# Patient Record
Sex: Female | Born: 1990 | Race: Black or African American | Hispanic: Yes | Marital: Single | Smoking: Never smoker
Health system: Southern US, Community
[De-identification: ages and names within clinical notes are randomized; demographics above are authoritative.]

## PROBLEM LIST (undated history)

## (undated) ENCOUNTER — Ambulatory Visit (HOSPITAL_COMMUNITY): Admission: EM | Source: Home / Self Care

## (undated) DIAGNOSIS — E282 Polycystic ovarian syndrome: Secondary | ICD-10-CM

## (undated) DIAGNOSIS — O039 Complete or unspecified spontaneous abortion without complication: Secondary | ICD-10-CM

## (undated) HISTORY — DX: Complete or unspecified spontaneous abortion without complication: O03.9

## (undated) HISTORY — PX: NO PAST SURGERIES: SHX2092

## (undated) HISTORY — PX: DILATION AND CURETTAGE OF UTERUS: SHX78

---
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
## 2009-07-03 ENCOUNTER — Emergency Department: Payer: Self-pay | Admitting: Emergency Medicine

## 2010-07-19 ENCOUNTER — Ambulatory Visit: Payer: Self-pay | Admitting: Family Medicine

## 2010-07-21 ENCOUNTER — Emergency Department: Payer: Self-pay | Admitting: Emergency Medicine

## 2010-10-08 ENCOUNTER — Observation Stay: Payer: Self-pay

## 2010-10-15 ENCOUNTER — Observation Stay: Payer: Self-pay

## 2010-10-16 ENCOUNTER — Ambulatory Visit: Payer: Self-pay | Admitting: Obstetrics and Gynecology

## 2010-11-03 ENCOUNTER — Observation Stay: Payer: Self-pay | Admitting: Obstetrics and Gynecology

## 2010-11-22 ENCOUNTER — Observation Stay: Payer: Self-pay

## 2010-12-03 ENCOUNTER — Inpatient Hospital Stay: Payer: Self-pay

## 2014-07-13 ENCOUNTER — Emergency Department: Payer: Self-pay | Admitting: Emergency Medicine

## 2014-07-13 LAB — CBC
HCT: 42.3 % (ref 35.0–47.0)
HGB: 14.1 g/dL (ref 12.0–16.0)
MCH: 30.2 pg (ref 26.0–34.0)
MCHC: 33.3 g/dL (ref 32.0–36.0)
MCV: 91 fL (ref 80–100)
PLATELETS: 279 10*3/uL (ref 150–440)
RBC: 4.67 10*6/uL (ref 3.80–5.20)
RDW: 12.8 % (ref 11.5–14.5)
WBC: 9.5 10*3/uL (ref 3.6–11.0)

## 2014-07-13 LAB — COMPREHENSIVE METABOLIC PANEL
ALBUMIN: 3.6 g/dL (ref 3.4–5.0)
AST: 18 U/L (ref 15–37)
Alkaline Phosphatase: 82 U/L
Anion Gap: 6 — ABNORMAL LOW (ref 7–16)
BILIRUBIN TOTAL: 0.2 mg/dL (ref 0.2–1.0)
BUN: 17 mg/dL (ref 7–18)
CALCIUM: 8.5 mg/dL (ref 8.5–10.1)
CHLORIDE: 109 mmol/L — AB (ref 98–107)
CREATININE: 0.89 mg/dL (ref 0.60–1.30)
Co2: 29 mmol/L (ref 21–32)
EGFR (Non-African Amer.): >60
Glucose: 71 mg/dL (ref 65–99)
Osmolality: 287 (ref 275–301)
POTASSIUM: 4.2 mmol/L (ref 3.5–5.1)
SGPT (ALT): 21 U/L
Sodium: 144 mmol/L (ref 136–145)
Total Protein: 7 g/dL (ref 6.4–8.2)

## 2014-07-13 LAB — URINALYSIS, COMPLETE
BLOOD: NEGATIVE
Bilirubin,UR: NEGATIVE
GLUCOSE, UR: NEGATIVE mg/dL (ref 0–75)
Ketone: NEGATIVE
Leukocyte Esterase: NEGATIVE
Nitrite: NEGATIVE
PROTEIN: NEGATIVE
Ph: 5 (ref 4.5–8.0)
RBC,UR: 1 /HPF (ref 0–5)
SPECIFIC GRAVITY: 1.02 (ref 1.003–1.030)
Squamous Epithelial: 1
WBC UR: 2 /HPF (ref 0–5)

## 2014-07-13 LAB — GC/CHLAMYDIA PROBE AMP

## 2014-07-13 LAB — HCG, QUANTITATIVE, PREGNANCY

## 2014-07-13 LAB — WET PREP, GENITAL

## 2014-11-19 ENCOUNTER — Emergency Department: Payer: Self-pay | Admitting: Emergency Medicine

## 2014-12-22 ENCOUNTER — Emergency Department
Admit: 2014-12-22 | Disposition: A | Discharge status: Home / Self Care | Payer: Self-pay | Admitting: Emergency Medicine

## 2015-04-01 LAB — CBC
HCT: 38.7 % (ref 35.0–47.0)
HGB: 12.9 g/dL (ref 12.0–16.0)
MCH: 29.1 pg (ref 26.0–34.0)
MCHC: 33.3 g/dL (ref 32.0–36.0)
MCV: 87 fL (ref 80–100)
Platelet: 291 10*3/uL (ref 150–440)
RBC: 4.44 10*6/uL (ref 3.80–5.20)
RDW: 13.4 % (ref 11.5–14.5)
WBC: 12.5 10*3/uL — AB (ref 3.6–11.0)

## 2015-04-01 LAB — COMPREHENSIVE METABOLIC PANEL
ALBUMIN: 3.7 g/dL
ALK PHOS: 64 U/L
Anion Gap: 7 (ref 7–16)
BUN: 11 mg/dL
Bilirubin,Total: 0.3 mg/dL
CO2: 26 mmol/L
Calcium, Total: 8.8 mg/dL — ABNORMAL LOW
Chloride: 104 mmol/L
Creatinine: 0.52 mg/dL
EGFR (African American): >60
EGFR (Non-African Amer.): >60
GLUCOSE: 75 mg/dL
POTASSIUM: 3.4 mmol/L — AB
SGOT(AST): 18 U/L
SGPT (ALT): 16 U/L
Sodium: 137 mmol/L
Total Protein: 6.8 g/dL

## 2015-04-01 LAB — URINALYSIS, COMPLETE
BACTERIA: NONE SEEN
BILIRUBIN, UR: NEGATIVE
BLOOD: NEGATIVE
GLUCOSE, UR: NEGATIVE mg/dL (ref 0–75)
Ketone: NEGATIVE
LEUKOCYTE ESTERASE: NEGATIVE
Nitrite: NEGATIVE
PROTEIN: NEGATIVE
Ph: 5 (ref 4.5–8.0)
Specific Gravity: 1.023 (ref 1.003–1.030)

## 2015-04-01 LAB — HCG, QUANTITATIVE, PREGNANCY: Beta Hcg, Quant.: 237510 m[IU]/mL — ABNORMAL HIGH

## 2016-08-27 ENCOUNTER — Emergency Department
Admission: EM | Admit: 2016-08-27 | Discharge: 2016-08-27 | Disposition: A | Discharge status: Home / Self Care | Payer: Medicaid Other | Attending: Emergency Medicine | Admitting: Emergency Medicine

## 2016-08-27 ENCOUNTER — Encounter: Payer: Self-pay | Admitting: Emergency Medicine

## 2016-08-27 ENCOUNTER — Emergency Department: Payer: Medicaid Other

## 2016-08-27 DIAGNOSIS — O26891 Other specified pregnancy related conditions, first trimester: Secondary | ICD-10-CM | POA: Diagnosis present

## 2016-08-27 DIAGNOSIS — Z3A01 Less than 8 weeks gestation of pregnancy: Secondary | ICD-10-CM | POA: Insufficient documentation

## 2016-08-27 DIAGNOSIS — O200 Threatened abortion: Secondary | ICD-10-CM | POA: Insufficient documentation

## 2016-08-27 LAB — CBC
HCT: 38.9 % (ref 35.0–47.0)
Hemoglobin: 13.5 g/dL (ref 12.0–16.0)
MCH: 30.2 pg (ref 26.0–34.0)
MCHC: 34.8 g/dL (ref 32.0–36.0)
MCV: 86.9 fL (ref 80.0–100.0)
PLATELETS: 300 10*3/uL (ref 150–440)
RBC: 4.47 MIL/uL (ref 3.80–5.20)
RDW: 12.9 % (ref 11.5–14.5)
WBC: 9.1 10*3/uL (ref 3.6–11.0)

## 2016-08-27 LAB — ABO/RH: ABO/RH(D): A POS

## 2016-08-27 LAB — HCG, QUANTITATIVE, PREGNANCY: HCG, BETA CHAIN, QUANT, S: 57057 m[IU]/mL — AB (ref <5)

## 2016-08-27 NOTE — ED Triage Notes (Signed)
Pt reports abdominal cramping and spotting since last week. Pt is [redacted] weeks pregnant. Pt states she changes a pad about twice a day.  Last OB visit on 12/21 due to come back in 5 weeks.

## 2016-08-27 NOTE — ED Provider Notes (Signed)
[facility] Emergency Department Provider Note  ____________________________________________   I have reviewed the triage vital signs and the nursing notes.   HISTORY  Chief Complaint Vaginal Bleeding and Abdominal Cramping    HPI Paige [name] is a 25 y.o. female resents today complaining of abdominal cramping, and vaginal bleeding. Patient is partially 6 weeks pertinent. Last missed her period was November 17. She is G4 P1 with a TAB and SAB in the past. Patient states that she does not having cramping at this time. She had some light pink discharge last week which got better and then today she had bleeding that was less than a period. It was mostlydark blood she describes it. She denies any ongoing cramping, she is not had any passage of fetal tissue, she denies any significant pain or lightheadedness.      History reviewed. No pertinent past medical history.  There are no active problems to display for this patient.   History reviewed. No pertinent surgical history.  Prior to Admission medications   Not on File    Allergies Latex  No family history on file.  Social History Social History  Substance Use Topics  . Smoking status: Not on file  . Smokeless tobacco: Not on file  . Alcohol use Not on file    Review of Systems Constitutional: No fever/chills Eyes: No visual changes. ENT: No sore throat. No stiff neck no neck pain Cardiovascular: Denies chest pain. Respiratory: Denies shortness of breath. Gastrointestinal:   no vomiting.  No diarrhea.  No constipation. Genitourinary: Negative for dysuria. Musculoskeletal: Negative lower extremity swelling Skin: Negative for rash. Neurological: Negative for severe headaches, focal weakness or numbness. 10-point ROS otherwise negative.  ____________________________________________   PHYSICAL EXAM:  VITAL SIGNS: ED Triage Vitals  Enc Vitals Group     BP 08/27/16 1016 116/70     Pulse  Rate 08/27/16 1016 73     Resp 08/27/16 1016 18     Temp 08/27/16 1016 97.7 F (36.5 C)     Temp Source 08/27/16 1016 Oral     SpO2 08/27/16 1016 99 %     Weight 08/27/16 1241 129 lb (58.5 kg)     Height 08/27/16 1241 5\' 3"  (1.6 m)     Head Circumference --      Peak Flow --      Pain Score 08/27/16 1016 8     Pain Loc --      Pain Edu? --      Excl. in GC? --     Constitutional: Alert and oriented. Well appearing and in no acute distress. Eyes: Conjunctivae are normal. PERRL. EOMI. Head: Atraumatic. Nose: No congestion/rhinnorhea. Mouth/Throat: Mucous membranes are moist.  Oropharynx non-erythematous. Neck: No stridor.   Nontender with no meningismus Cardiovascular: Normal rate, regular rhythm. Grossly normal heart sounds.  Good peripheral circulation. Respiratory: Normal respiratory effort.  No retractions. Lungs CTAB. Abdominal: Soft and nontender. No distention. No guarding no rebound Back:  There is no focal tenderness or step off.  there is no midline tenderness there are no lesions noted. there is no CVA tenderness Patient declines pelvic exam Musculoskeletal: No lower extremity tenderness, no upper extremity tenderness. No joint effusions, no DVT signs strong distal pulses no edema Neurologic:  Normal speech and language. No gross focal neurologic deficits are appreciated.  Skin:  Skin is warm, dry and intact. No rash noted. Psychiatric: Mood and affect are normal. Speech and behavior are normal.  ____________________________________________   LABS (  all labs ordered are listed, but only abnormal results are displayed)  Labs Reviewed  HCG, QUANTITATIVE, PREGNANCY - Abnormal; Notable for the following:       Result Value   hCG, Beta Chain, Quant, S 57,057 (*)    All other components within normal limits  CBC  ABO/RH   ____________________________________________  EKG  I personally interpreted any EKGs ordered by me or  triage  ____________________________________________  RADIOLOGY  I reviewed any imaging ordered by me or triage that were performed during my shift and, if possible, patient and/or family made aware of any abnormal findings. ____________________________________________   PROCEDURES  Procedure(s) performed: None  Procedures  Critical Care performed: None  ____________________________________________   INITIAL IMPRESSION / ASSESSMENT AND PLAN / ED COURSE  Pertinent labs & imaging results that were available during my care of the patient were reviewed by me and considered in my medical decision making (see chart for details).  Patient with spotting in early pregnancy. She refuses pelvic exam. Ultrasound shows an IUP. She was as without pelvic exam is a limits my ability to assess her pathology however, she is very comfortable with this. Clearly, this is her choice. She changes her mind or has any new or worrisome symptoms he understands she can come back at any time. She does have outpatient follow-up. She is Rh+. Vital signs are reassuring. Hemoglobin is reassuring. Return precautions are given and understood.  Clinical Course    ____________________________________________   FINAL CLINICAL IMPRESSION(S) / ED DIAGNOSES  Final diagnoses:  None      This chart was dictated using voice recognition software.  Despite best efforts to proofread,  errors can occur which can change meaning.      Jeanmarie PlantJames A Apolo Cutshaw, MD 08/27/16 [phone]

## 2016-08-27 NOTE — ED Notes (Signed)
Pt in ultrasound at this time

## 2017-02-09 ENCOUNTER — Emergency Department: Payer: Medicaid Other

## 2017-02-09 ENCOUNTER — Emergency Department
Admission: EM | Admit: 2017-02-09 | Discharge: 2017-02-09 | Disposition: A | Discharge status: Home / Self Care | Payer: Medicaid Other | Attending: Emergency Medicine | Admitting: Emergency Medicine

## 2017-02-09 ENCOUNTER — Encounter: Payer: Self-pay | Admitting: Emergency Medicine

## 2017-02-09 DIAGNOSIS — Y999 Unspecified external cause status: Secondary | ICD-10-CM | POA: Diagnosis not present

## 2017-02-09 DIAGNOSIS — S63692A Other sprain of right middle finger, initial encounter: Secondary | ICD-10-CM | POA: Insufficient documentation

## 2017-02-09 DIAGNOSIS — Z91040 Latex allergy status: Secondary | ICD-10-CM | POA: Insufficient documentation

## 2017-02-09 DIAGNOSIS — Y939 Activity, unspecified: Secondary | ICD-10-CM | POA: Insufficient documentation

## 2017-02-09 DIAGNOSIS — W2209XA Striking against other stationary object, initial encounter: Secondary | ICD-10-CM | POA: Insufficient documentation

## 2017-02-09 DIAGNOSIS — Y929 Unspecified place or not applicable: Secondary | ICD-10-CM | POA: Insufficient documentation

## 2017-02-09 DIAGNOSIS — S6991XA Unspecified injury of right wrist, hand and finger(s), initial encounter: Secondary | ICD-10-CM | POA: Diagnosis present

## 2017-02-09 DIAGNOSIS — S60221A Contusion of right hand, initial encounter: Secondary | ICD-10-CM | POA: Diagnosis not present

## 2017-02-09 MED ORDER — IBUPROFEN 600 MG PO TABS: 600.0000 mg | ORAL_TABLET | Freq: Three times a day (TID) | ORAL | 0 refills | Status: DC | PRN

## 2017-02-09 NOTE — ED Provider Notes (Signed)
[facility] Emergency Department Provider Note ____________________________________________  Time seen: 11:37 AM  I have reviewed the triage vital signs and the nursing notes.  HISTORY  Chief Complaint  Hand Pain   HPI Paige [name] is a 26 y.o. female is here complaining of right hand pain.Patient states he was trying to stop her son from running into a door and actually hit her right hand on the door frame. Patient states she is having pain in her entire hand but more swollen and painful to her right middle finger. Patient denies any previous injury to her hand. She rates her pain as an 8 out of 10.   History reviewed. No pertinent past medical history.  There are no active problems to display for this patient.   History reviewed. No pertinent surgical history.  Prior to Admission medications   Medication Sig Start Date End Date Taking? Authorizing Provider  ibuprofen (ADVIL,MOTRIN) 600 MG tablet Take 1 tablet (600 mg total) by mouth every 8 (eight) hours as needed. 02/09/17   Tommi RumpsSummers, Rhonda L, PA-C    Allergies Latex  No family history on file.  Social History Social History  Substance Use Topics  . Smoking status: Never Smoker  . Smokeless tobacco: Never Used  . Alcohol use Yes     Comment: occasional    Review of Systems  Constitutional: Negative for fever. Cardiovascular: Negative for chest pain. Respiratory: Negative for shortness of breath. Musculoskeletal: Positive right hand pain. Skin: Negative for rash. Neurological: Negative for focal weakness or numbness. ____________________________________________  PHYSICAL EXAM:  VITAL SIGNS: ED Triage Vitals  Enc Vitals Group     BP 02/09/17 1058 (!) 92/52     Pulse Rate 02/09/17 1058 88     Resp 02/09/17 1058 20     Temp 02/09/17 1058 98.7 F (37.1 C)     Temp Source 02/09/17 1058 Oral     SpO2 02/09/17 1058 98 %     Weight 02/09/17 1059 134 lb (60.8 kg)     Height 02/09/17  1059 5\' 3"  (1.6 m)     Head Circumference --      Peak Flow --      Pain Score --      Pain Loc --      Pain Edu? --      Excl. in GC? --    Radiology:  Right hand x-ray per radiologist negative for fracture. I, Tommi Rumpshonda L Summers, personally viewed and evaluated these images (plain radiographs) as part of my medical decision making, as well as reviewing the written report by the radiologist.    Constitutional: Alert and oriented. Well appearing and in no distress. Head: Normocephalic and atraumatic. Neck: No stridor Cardiovascular: Normal rate, regular rhythm. Normal distal pulses. Respiratory: Normal respiratory effort. No wheezes/rales/rhonchi. Musculoskeletal: On examination of the right hand there is some generalized tenderness however the PIP joint of the right third finger is moderately swollen and tender. Range of motion is restricted in flexion and extension secondary to her pain and probable swelling. Motor sensory function intact. Capillary refill less than 3 seconds. Neurologic:  Normal gait. Normal speech and language. No gross focal neurologic deficits are appreciated. Skin:  Skin is warm, dry and intact. No rash noted. Psychiatric: Mood and affect are normal. Patient exhibits appropriate insight and judgment.    INITIAL IMPRESSION / ASSESSMENT AND PLAN / ED COURSE  Patient was placed in a finger splint and encouraged to use ice and elevation to decrease swelling.  We discussed possibility of a tendon injury but at this time I believe is more the marked swelling in her finger that restricts her extension and flexion. She is advised to follow-up with the orthopedist listed on her discharge papers if she continues to have difficulty using her finger or any concerns. She is given a prescription for ibuprofen 600 mg 3 times a day with food.    ____________________________________________  FINAL CLINICAL IMPRESSION(S) / ED DIAGNOSES  Final diagnoses:  Contusion of right  hand, initial encounter  Other sprain of right middle finger, initial encounter     Tommi Rumps, PA-C 02/09/17 1441    Emily Filbert, MD 02/09/17 1513

## 2017-02-09 NOTE — ED Triage Notes (Signed)
States she was trying to stop her sone from running into a door  Hit her right hand on door frame  Having pain to entire hand  But swelling noted to right middle finger

## 2017-02-09 NOTE — Discharge Instructions (Signed)
Wear splint for the next 4-5 days for added support and protection. Begin taking ibuprofen 600 mg 3 times a day with food. Ice and elevate to decrease swelling. Follow-up with Dr. Martha ClanKrasinski if any worsening of your hand injury or urgent concerns. Also if after swelling has improved in your third finger your having any problems or concerns follow-up with Dr. Martha ClanKrasinski. You may also follow-up with [place] if any continued problems.

## 2017-05-16 ENCOUNTER — Encounter: Payer: Self-pay | Admitting: *Deleted

## 2017-05-16 ENCOUNTER — Emergency Department
Admission: EM | Admit: 2017-05-16 | Discharge: 2017-05-16 | Disposition: A | Discharge status: Home / Self Care | Payer: Medicaid Other | Attending: Emergency Medicine | Admitting: Emergency Medicine

## 2017-05-16 DIAGNOSIS — R05 Cough: Secondary | ICD-10-CM | POA: Diagnosis present

## 2017-05-16 DIAGNOSIS — R6889 Other general symptoms and signs: Secondary | ICD-10-CM | POA: Insufficient documentation

## 2017-05-16 DIAGNOSIS — Z91040 Latex allergy status: Secondary | ICD-10-CM | POA: Diagnosis not present

## 2017-05-16 MED ORDER — GUAIFENESIN-CODEINE 100-10 MG/5ML PO SYRP: 5.0000 mL | ORAL_SOLUTION | Freq: Three times a day (TID) | ORAL | 0 refills | Status: AC | PRN

## 2017-05-16 MED ORDER — LIDOCAINE VISCOUS 2 % MT SOLN: 10.0000 mL | OROMUCOSAL | 0 refills | Status: DC | PRN

## 2017-05-16 MED ORDER — FLUTICASONE PROPIONATE 50 MCG/ACT NA SUSP: 2.0000 | Freq: Every day | NASAL | 0 refills | Status: DC

## 2017-05-16 NOTE — ED Notes (Signed)
See triage note  Presents with ear pain  Sore throat headache and some body aches which started yesterday  Afebrile on arrival denies any n/v/d

## 2017-05-16 NOTE — ED Triage Notes (Signed)
States ear pain, body aches, headache and sore throat since yesterday

## 2017-05-16 NOTE — ED Provider Notes (Signed)
[facility] Emergency Department Provider Note  ____________________________________________  Time seen: Approximately 10:42 AM  I have reviewed the triage vital signs and the nursing notes.   HISTORY  Chief Complaint Sore Throat; Cough; and Generalized Body Aches    HPI Paige [name] is a 26 y.o. female that presents to the emergency department with nasal conestion, sore throat, nonprodutive cough, generalized body aches for one day.Patient does not smoke. No alleviating measures have been attempted. She has not received flu vaccination this year. No sick contacts. No fever, shortness of breath, chest pain, nausea, vomiting, abdominal pain.   History reviewed. No pertinent past medical history.  There are no active problems to display for this patient.   History reviewed. No pertinent surgical history.  Prior to Admission medications   Medication Sig Start Date End Date Taking? Authorizing Provider  fluticasone (FLONASE) 50 MCG/ACT nasal spray Place 2 sprays into both nostrils daily. 05/16/17 05/16/18  Enid Derry, PA-C  guaiFENesin-codeine (ROBITUSSIN AC) 100-10 MG/5ML syrup Take 5 mLs by mouth 3 (three) times daily as needed for cough. 05/16/17 05/18/17  Enid Derry, PA-C  ibuprofen (ADVIL,MOTRIN) 600 MG tablet Take 1 tablet (600 mg total) by mouth every 8 (eight) hours as needed. 02/09/17   Tommi Rumps, PA-C  lidocaine (XYLOCAINE) 2 % solution Use as directed 10 mLs in the mouth or throat as needed for mouth pain. 05/16/17   Enid Derry, PA-C    Allergies Latex  History reviewed. No pertinent family history.  Social History Social History  Substance Use Topics  . Smoking status: Never Smoker  . Smokeless tobacco: Never Used  . Alcohol use Yes     Comment: occasional     Review of Systems  Constitutional: No fever/chills Eyes: No visual changes. No discharge. ENT: Positive for congestion and rhinorrhea. Cardiovascular: No chest  pain. Respiratory: Positive for cough. No SOB. Gastrointestinal: No abdominal pain.  No nausea, no vomiting.  No diarrhea.  No constipation. Skin: Negative for rash, abrasions, lacerations, ecchymosis.   ____________________________________________   PHYSICAL EXAM:  VITAL SIGNS: ED Triage Vitals [05/16/17 1016]  Enc Vitals Group     BP (!) 121/58     Pulse Rate 73     Resp 18     Temp 98.6 F (37 C)     Temp Source Oral     SpO2 100 %     Weight 135 lb (61.2 kg)     Height  (1.6 m)     Head Circumference      Peak Flow      Pain Score 9     Pain Loc      Pain Edu?      Excl. in GC?      Constitutional: Alert and oriented. Well appearing and in no acute distress. Eyes: Conjunctivae are normal. PERRL. EOMI. No discharge. Head: Atraumatic. ENT: No frontal and maxillary sinus tenderness.      Ears: Tympanic membranes pearly gray with good landmarks. No discharge.      Nose: Mild congestion/rhinnorhea.      Mouth/Throat: Mucous membranes are moist. Oropharynx non-erythematous. Tonsils not enlarged. No exudates. Uvula midline. Neck: No stridor.   Hematological/Lymphatic/Immunilogical: No cervical lymphadenopathy. Cardiovascular: Normal rate, regular rhythm.  Good peripheral circulation. Respiratory: Normal respiratory effort without tachypnea or retractions. Lungs CTAB. Good air entry to the bases with no decreased or absent breath sounds. Gastrointestinal: Bowel sounds 4 quadrants. Soft and nontender to palpation. No guarding or rigidity. No palpable  masses. No distention. Musculoskeletal: Full range of motion to all extremities. No gross deformities appreciated. Neurologic:  Normal speech and language. No gross focal neurologic deficits are appreciated.  Skin:  Skin is warm, dry and intact. No rash noted.   ____________________________________________   LABS (all labs ordered are listed, but only abnormal results are displayed)  Labs Reviewed - No data to  display ____________________________________________  EKG   ____________________________________________  RADIOLOGY  No results found.  ____________________________________________    PROCEDURES  Procedure(s) performed:    Procedures    Medications - No data to display   ____________________________________________   INITIAL IMPRESSION / ASSESSMENT AND PLAN / ED COURSE  Pertinent labs & imaging results that were available during my care of the patient were reviewed by me and considered in my medical decision making (see chart for details).  Review of the  [place] was performed in accordance of the [facility] prior to dispensing any controlled drugs.  Patient's diagnosis is consistent with influenza-like illness. Vital signs and exam are reassuring. Patient does not wish to wait for a flu test. Patient feels comfortable going home. Patient will be discharged home with prescriptions for flonase, Robitussin, viscous lidocaine. Patient is to follow up with PCP as needed or otherwise directed. Patient is given ED precautions to return to the ED for any worsening or new symptoms.     ____________________________________________  FINAL CLINICAL IMPRESSION(S) / ED DIAGNOSES  Final diagnoses:  Flu-like symptoms      NEW MEDICATIONS STARTED DURING THIS VISIT:  New Prescriptions   FLUTICASONE (FLONASE) 50 MCG/ACT NASAL SPRAY    Place 2 sprays into both nostrils daily.   GUAIFENESIN-CODEINE (ROBITUSSIN AC) 100-10 MG/5ML SYRUP    Take 5 mLs by mouth 3 (three) times daily as needed for cough.   LIDOCAINE (XYLOCAINE) 2 % SOLUTION    Use as directed 10 mLs in the mouth or throat as needed for mouth pain.        This chart was dictated using voice recognition software/Dragon. Despite best efforts to proofread, errors can occur which can change the meaning. Any change was purely unintentional.    Enid Derry, PA-C 05/16/17 1110    Alphonzo Lemmings Rudy Jew, MD 05/16/17  [phone]

## 2017-07-26 ENCOUNTER — Encounter: Payer: Self-pay | Admitting: Maternal Newborn

## 2017-09-15 IMAGING — US US OB COMP LESS 14 WK
1 of 2 series · 13 of 28 positions shown · non-contrast
Comparison: None applicable

CLINICAL DATA: Abdominal cramping and spotting for 1 week.
Quantitative beta HCG today is [DATE].

LMP was07/15/2016.
Gestational age by LMP is6 weeks 1 day.
EDC by LMP is04/21/2017.
EXAM:
OBSTETRIC <14 WK US AND TRANSVAGINAL OB US
TECHNIQUE: Both transabdominal and transvaginal ultrasound examinations were
performed for complete evaluation of the gestation as well as the
maternal uterus, adnexal regions, and pelvic cul-de-sac.
Transvaginal technique was performed to assess early pregnancy.

[Series 1: us ob comp less 14 wk · 0.09mm/px · 13 of 210 slices shown]
[im 9/210]
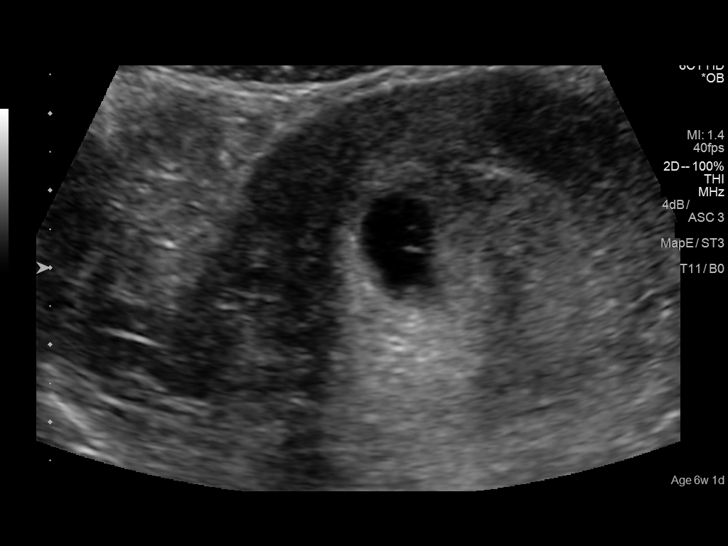
[im 25/210]
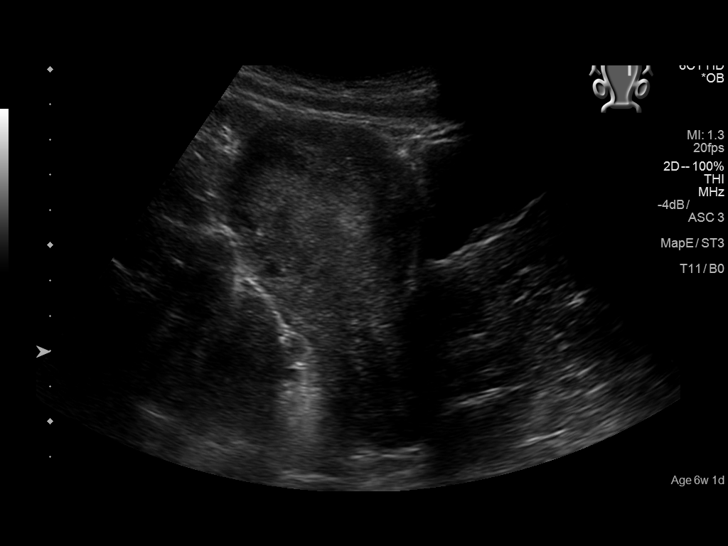
[im 41/210]
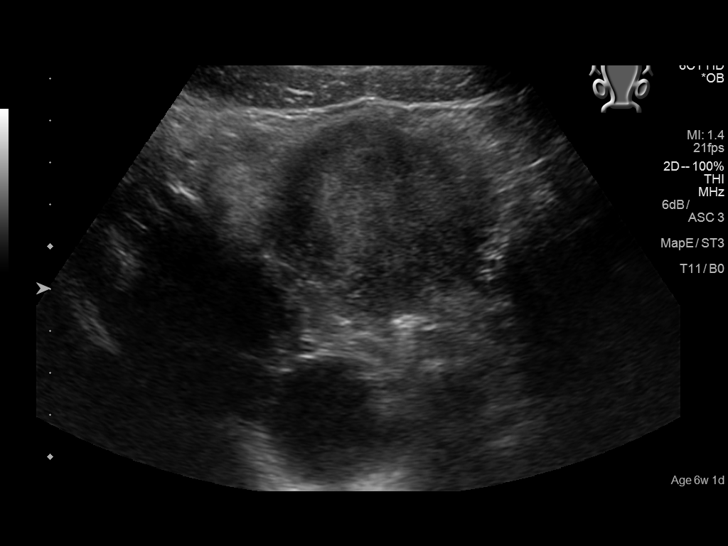
[im 57/210]
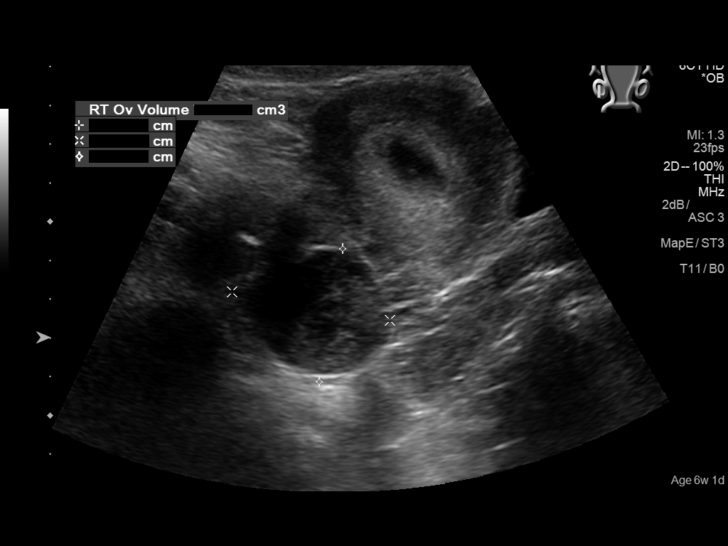
[im 73/210]
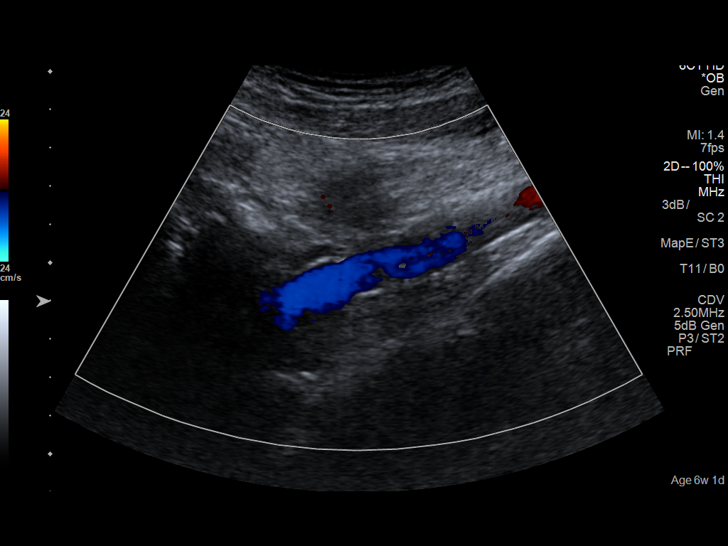
[im 89/210]
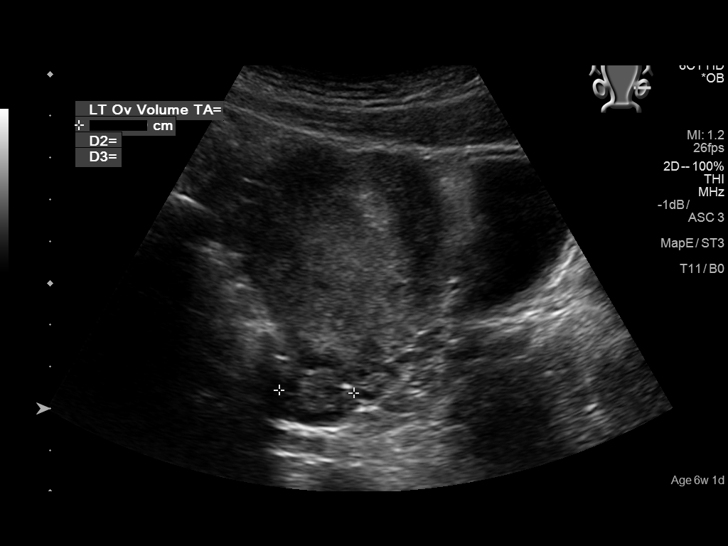
[im 113/210]
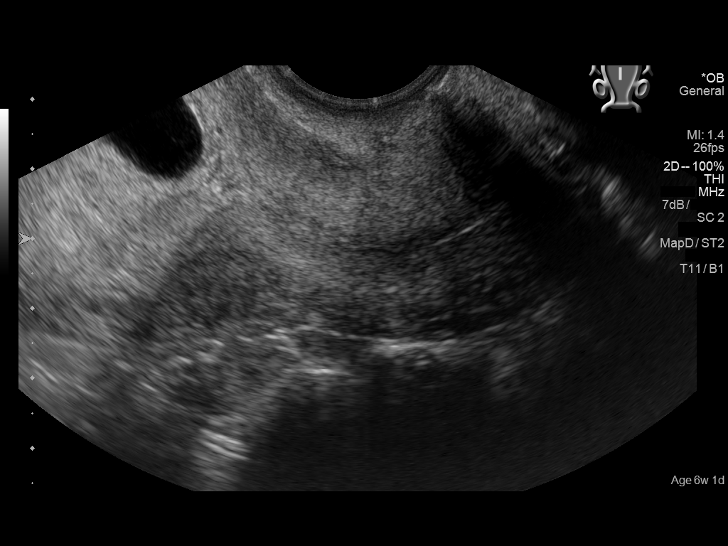
[im 129/210]
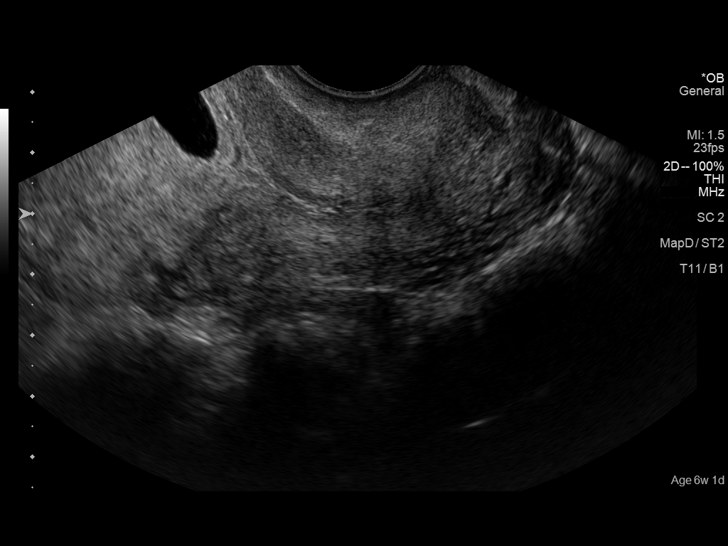
[im 145/210]
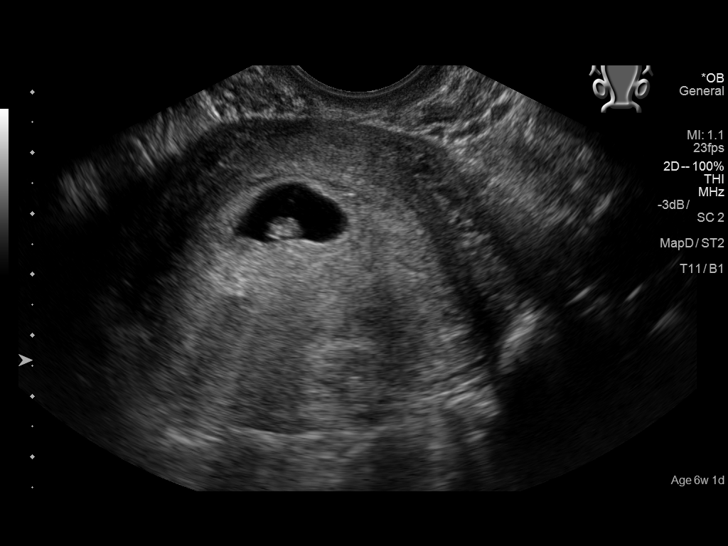
[im 161/210]
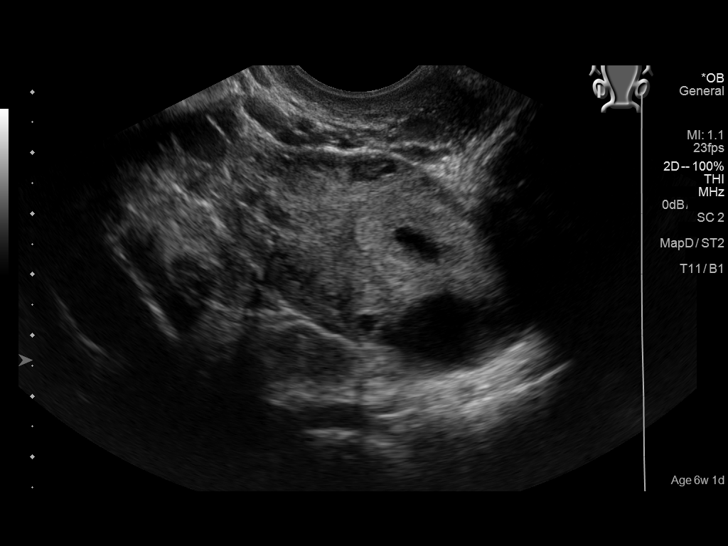
[im 177/210]
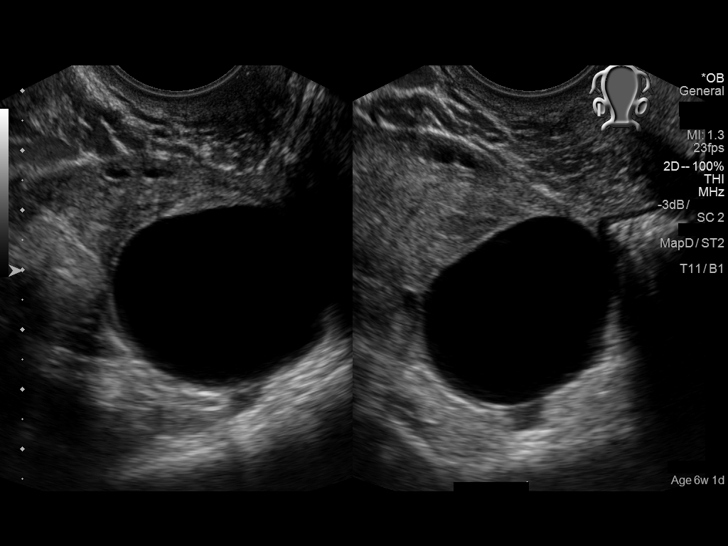
[im 193/210]
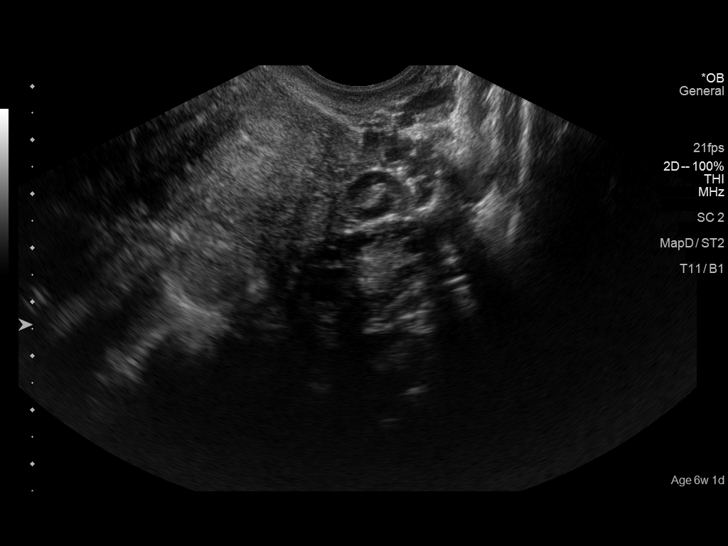
[im 210/210]
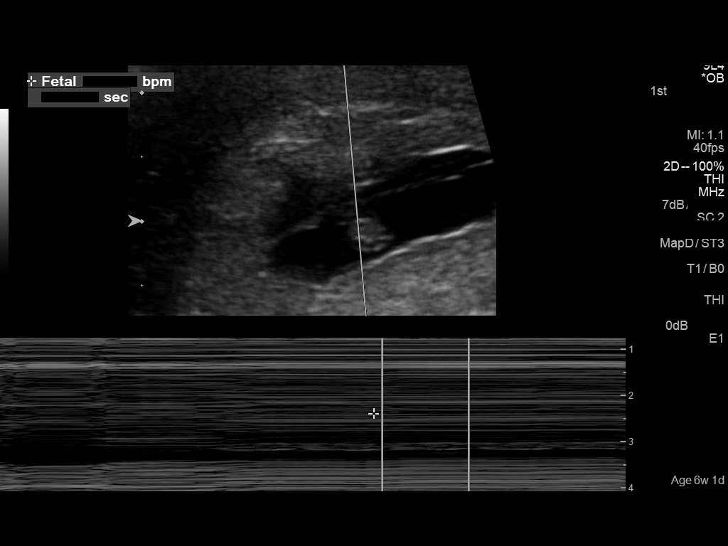

[13 of 28 positions shown; findings below may reference images not displayed]

FINDINGS: Intrauterine gestational sac: Present

Yolk sac:  Present

Embryo:  Present

Cardiac Activity: Present

Heart Rate: 118  bpm (confirmed with repeat M-mode tracing)

CRL:  4.2  mm   6 w   1 d                  US EDC: 04/21/2017

Subchorionic hemorrhage:  None visualized.

Maternal uterus/adnexae: A simple cyst is identified in the right
ovary measuring 3.6 x 3.1 x 3.3 cm. In adjacent possible right
corpus luteum cyst is 2.2 x 1.7 x 1.8 cm. The left ovary is normal
in appearance. There is no free pelvic fluid.
IMPRESSION: 1. Single living intrauterine embryo measuring 6 weeks 1 day.
2. Today's exam confirms clinical EDC of 04/21/2017.
3. Benign right ovarian cyst measures 3.6 cm.

## 2017-10-05 ENCOUNTER — Ambulatory Visit: Payer: Medicaid Other | Admitting: Obstetrics & Gynecology

## 2019-03-04 ENCOUNTER — Ambulatory Visit (INDEPENDENT_AMBULATORY_CARE_PROVIDER_SITE_OTHER): Payer: Medicaid Other | Admitting: Advanced Practice Midwife

## 2019-03-04 ENCOUNTER — Encounter: Payer: Self-pay | Admitting: Advanced Practice Midwife

## 2019-03-04 ENCOUNTER — Other Ambulatory Visit (HOSPITAL_COMMUNITY)
Admission: RE | Admit: 2019-03-04 | Discharge: 2019-03-04 | Disposition: A | Discharge status: Home / Self Care | Payer: Medicaid Other | Source: Ambulatory Visit | Attending: Advanced Practice Midwife | Admitting: Advanced Practice Midwife

## 2019-03-04 ENCOUNTER — Other Ambulatory Visit: Payer: Self-pay

## 2019-03-04 VITALS — BP 114/74 | Ht 62.0 in | Wt 152.0 lb

## 2019-03-04 DIAGNOSIS — Z113 Encounter for screening for infections with a predominantly sexual mode of transmission: Secondary | ICD-10-CM

## 2019-03-04 DIAGNOSIS — Z01419 Encounter for gynecological examination (general) (routine) without abnormal findings: Secondary | ICD-10-CM

## 2019-03-04 DIAGNOSIS — Z124 Encounter for screening for malignant neoplasm of cervix: Secondary | ICD-10-CM

## 2019-03-04 DIAGNOSIS — B379 Candidiasis, unspecified: Secondary | ICD-10-CM

## 2019-03-04 DIAGNOSIS — R1010 Upper abdominal pain, unspecified: Secondary | ICD-10-CM

## 2019-03-04 DIAGNOSIS — Z0000 Encounter for general adult medical examination without abnormal findings: Secondary | ICD-10-CM

## 2019-03-04 DIAGNOSIS — N926 Irregular menstruation, unspecified: Secondary | ICD-10-CM

## 2019-03-04 DIAGNOSIS — L680 Hirsutism: Secondary | ICD-10-CM

## 2019-03-04 MED ORDER — FLUCONAZOLE 150 MG PO TABS: 150.0000 mg | ORAL_TABLET | Freq: Once | ORAL | 3 refills | Status: AC

## 2019-03-04 NOTE — Patient Instructions (Addendum)
Diet for Polycystic Ovary Syndrome Polycystic ovary syndrome (PCOS) is a disorder of the chemicals (hormones) that regulate a woman's reproductive system, including monthly periods (menstruation). The condition causes important hormones to be out of balance. PCOS can:  Stop your periods or make them irregular.  Cause cysts to develop on your ovaries.  Make it difficult to get pregnant.  Stop your body from responding to the effects of insulin (insulin resistance). Insulin resistance can lead to obesity and diabetes. Changing what you eat can help you manage PCOS and improve your health. Following a balanced diet can help you lose weight and improve the way that your body uses insulin. What are tips for following this plan?  Follow a balanced diet for meals and snacks. Eat breakfast, lunch, dinner, and one or two snacks every day.  Include protein in each meal and snack.  Choose whole grains instead of products that are made with refined flour.  Eat a variety of foods.  Exercise regularly as told by your health care provider. Aim to do 30 or more minutes of exercise on most days of the week.  If you are overweight or obese: ? Pay attention to how many calories you eat. Cutting down on calories can help you lose weight. ? Work with your health care provider or a diet and nutrition specialist (dietitian) to figure out how many calories you need each day. What foods can I eat?  Fruits Include a variety of colors and types. All fruits are helpful for PCOS. Vegetables Include a variety of colors and types. All vegetables are helpful for PCOS. Grains Whole grains, such as whole wheat. Whole-grain breads, crackers, cereals, and pasta. Unsweetened oatmeal, bulgur, barley, quinoa, and brown rice. Tortillas made from corn or whole-wheat flour. Meats and other proteins Low-fat (lean) proteins, such as fish, chicken, beans, eggs, and tofu. Dairy Low-fat dairy products, such as skim milk,  cheese sticks, and yogurt. Beverages Low-fat or fat-free drinks, such as water, low-fat milk, sugar-free drinks, and small amounts of 100% fruit juice. Seasonings and condiments Ketchup. Mustard. Barbecue sauce. Relish. Low-fat or fat-free mayonnaise. Fats and oils Olive oil or canola oil. Walnuts and almonds. The items listed above may not be a complete list of recommended foods and beverages. Contact a dietitian for more options. What foods are not recommended? Foods that are high in calories or fat. Fried foods. Sweets. Products that are made from refined white flour, including white bread, pastries, white rice, and pasta. The items listed above may not be a complete list of foods and beverages to avoid. Contact a dietitian for more information. Summary  PCOS is a hormonal imbalance that affects a woman's reproductive system.  You can help to manage your PCOS by exercising regularly and eating a healthy, varied diet of vegetables, fruit, whole grains, low-fat (lean) protein, and low-fat dairy products.  Changing what you eat can improve the way that your body uses insulin, help your hormones reach normal levels, and help you lose weight. This information is not intended to replace advice given to you by your health care provider. Make sure you discuss any questions you have with your health care provider. Document Released: 12/07/2015 Document Revised: 12/05/2018 Document Reviewed: 06/19/2017 Elsevier Patient Education  2020 [employer] Inc. Polycystic Ovarian Syndrome  Polycystic ovarian syndrome (PCOS) is a common hormonal disorder among women of reproductive age. In most women with PCOS, many small fluid-filled sacs (cysts) grow on the ovaries, and the cysts are not part of a  normal menstrual cycle. PCOS can cause problems with your menstrual periods and make it difficult to get pregnant. It can also cause an increased risk of miscarriage with pregnancy. If it is not treated, PCOS can lead  to serious health problems, such as diabetes and heart disease. What are the causes? The cause of PCOS is not known, but it may be the result of a combination of certain factors, such as:  Irregular menstrual cycle.  High levels of certain hormones (androgens).  Problems with the hormone that helps to control blood sugar (insulin resistance).  Certain genes. What increases the risk? This condition is more likely to develop in women who have a family history of PCOS. What are the signs or symptoms? Symptoms of PCOS may include:  Multiple ovarian cysts.  Infrequent periods or no periods.  Periods that are too frequent or too heavy.  Unpredictable periods.  Inability to get pregnant (infertility) because of not ovulating.  Increased growth of hair on the face, chest, stomach, back, thumbs, thighs, or toes.  Acne or oily skin. Acne may develop during adulthood, and it may not respond to treatment.  Pelvic pain.  Weight gain or obesity.  Patches of thickened and dark brown or black skin on the neck, arms, breasts, or thighs (acanthosis nigricans).  Excess hair growth on the face, chest, abdomen, or upper thighs (hirsutism). How is this diagnosed? This condition is diagnosed based on:  Your medical history.  A physical exam, including a pelvic exam. Your health care provider may look for areas of increased hair growth on your skin.  Tests, such as: ? Ultrasound. This may be used to examine the ovaries and the lining of the uterus (endometrium) for cysts. ? Blood tests. These may be used to check levels of sugar (glucose), female hormone (testosterone), and female hormones (estrogen and progesterone) in your blood. How is this treated? There is no cure for PCOS, but treatment can help to manage symptoms and prevent more health problems from developing. Treatment varies depending on:  Your symptoms.  Whether you want to have a baby or whether you need birth control  (contraception). Treatment may include nutrition and lifestyle changes along with:  Progesterone hormone to start a menstrual period.  Birth control pills to help you have regular menstrual periods.  Medicines to make you ovulate, if you want to get pregnant.  Medicine to reduce excessive hair growth.  Surgery, in severe cases. This may involve making small holes in one or both of your ovaries. This decreases the amount of testosterone that your body produces. Follow these instructions at home:  Take over-the-counter and prescription medicines only as told by your health care provider.  Follow a healthy meal plan. This can help you reduce the effects of PCOS. ? Eat a healthy diet that includes lean proteins, complex carbohydrates, fresh fruits and vegetables, low-fat dairy products, and healthy fats. Make sure to eat enough fiber.  If you are overweight, lose weight as told by your health care provider. ? Losing 10% of your body weight may improve symptoms. ? Your health care provider can determine how much weight loss is best for you and can help you lose weight safely.  Keep all follow-up visits as told by your health care provider. This is important. Contact a health care provider if:  Your symptoms do not get better with medicine.  You develop new symptoms. This information is not intended to replace advice given to you by your health care provider.  Make sure you discuss any questions you have with your health care provider. Document Released: 12/09/2004 Document Revised: 07/28/2017 Document Reviewed: 01/31/2016 Elsevier Patient Education  2020 [employer] American Heart Association (AHA) Exercise Recommendation  Being physically active is important to prevent heart disease and stroke, the nation's No. 1and No. 5killers. To improve overall cardiovascular health, we suggest at least 150 minutes per week of moderate exercise or 75 minutes per week of vigorous exercise (or a  combination of moderate and vigorous activity). Thirty minutes a day, five times a week is an easy goal to remember. You will also experience benefits even if you divide your time into two or three segments of 10 to 15 minutes per day.  For people who would benefit from lowering their blood pressure or cholesterol, we recommend 40 minutes of aerobic exercise of moderate to vigorous intensity three to four times a week to lower the risk for heart attack and stroke.  Physical activity is anything that makes you move your body and burn calories.  This includes things like climbing stairs or playing sports. Aerobic exercises benefit your heart, and include walking, jogging, swimming or biking. Strength and stretching exercises are best for overall stamina and flexibility.  The simplest, positive change you can make to effectively improve your heart health is to start walking. It's enjoyable, free, easy, social and great exercise. A walking program is flexible and boasts high success rates because people can stick with it. It's easy for walking to become a regular and satisfying part of life.   For Overall Cardiovascular Health:  At least 30 minutes of moderate-intensity aerobic activity at least 5 days per week for a total of 150  OR   At least 25 minutes of vigorous aerobic activity at least 3 days per week for a total of 75 minutes; or a combination of moderate- and vigorous-intensity aerobic activity  AND   Moderate- to high-intensity muscle-strengthening activity at least 2 days per week for additional health benefits.  For Lowering Blood Pressure and Cholesterol  An average 40 minutes of moderate- to vigorous-intensity aerobic activity 3 or 4 times per week  What if I can't make it to the time goal? Something is always better than nothing! And everyone has to start somewhere. Even if you've been sedentary for years, today is the day you can begin to make healthy changes in your life.  If you don't think you'll make it for 30 or 40 minutes, set a reachable goal for today. You can work up toward your overall goal by increasing your time as you get stronger. Don't let all-or-nothing thinking rob you of doing what you can every day.  Source:http://www.heart.org   Mediterranean Diet A Mediterranean diet refers to food and lifestyle choices that are based on the traditions of countries located on the [place]. This way of eating has been shown to help prevent certain conditions and improve outcomes for people who have chronic diseases, like kidney disease and heart disease. What are tips for following this plan? Lifestyle  Cook and eat meals together with your family, when possible.  Drink enough fluid to keep your urine clear or pale yellow.  Be physically active every day. This includes: ? Aerobic exercise like running or swimming. ? Leisure activities like gardening, walking, or housework.  Get 7-8 hours of sleep each night.  If recommended by your health care provider, drink red wine in moderation. This means 1 glass a day for nonpregnant women and 2  glasses a day for men. A glass of wine equals 5 oz (150 mL). Reading food labels   Check the serving size of packaged foods. For foods such as rice and pasta, the serving size refers to the amount of cooked product, not dry.  Check the total fat in packaged foods. Avoid foods that have saturated fat or trans fats.  Check the ingredients list for added sugars, such as corn syrup. Shopping  At the grocery store, buy most of your food from the areas near the walls of the store. This includes: ? Fresh fruits and vegetables (produce). ? Grains, beans, nuts, and seeds. Some of these may be available in unpackaged forms or large amounts (in bulk). ? Fresh seafood. ? Poultry and eggs. ? Low-fat dairy products.  Buy whole ingredients instead of prepackaged foods.  Buy fresh fruits and vegetables in-season from  local farmers markets.  Buy frozen fruits and vegetables in resealable bags.  If you do not have access to quality fresh seafood, buy precooked frozen shrimp or canned fish, such as tuna, salmon, or sardines.  Buy small amounts of raw or cooked vegetables, salads, or olives from the deli or salad bar at your store.  Stock your pantry so you always have certain foods on hand, such as olive oil, canned tuna, canned tomatoes, rice, pasta, and beans. Cooking  Cook foods with extra-virgin olive oil instead of using butter or other vegetable oils.  Have meat as a side dish, and have vegetables or grains as your main dish. This means having meat in small portions or adding small amounts of meat to foods like pasta or stew.  Use beans or vegetables instead of meat in common dishes like chili or lasagna.  Experiment with different cooking methods. Try roasting or broiling vegetables instead of steaming or sauteing them.  Add frozen vegetables to soups, stews, pasta, or rice.  Add nuts or seeds for added healthy fat at each meal. You can add these to yogurt, salads, or vegetable dishes.  Marinate fish or vegetables using olive oil, lemon juice, garlic, and fresh herbs. Meal planning   Plan to eat 1 vegetarian meal one day each week. Try to work up to 2 vegetarian meals, if possible.  Eat seafood 2 or more times a week.  Have healthy snacks readily available, such as: ? Vegetable sticks with hummus. ? [place] yogurt. ? Fruit and nut trail mix.  Eat balanced meals throughout the week. This includes: ? Fruit: 2-3 servings a day ? Vegetables: 4-5 servings a day ? Low-fat dairy: 2 servings a day ? Fish, poultry, or lean meat: 1 serving a day ? Beans and legumes: 2 or more servings a week ? Nuts and seeds: 1-2 servings a day ? Whole grains: 6-8 servings a day ? Extra-virgin olive oil: 3-4 servings a day  Limit red meat and sweets to only a few servings a month What are my food choices?   Mediterranean diet ? Recommended  Grains: Whole-grain pasta. Brown rice. Bulgar wheat. Polenta. Couscous. Whole-wheat bread. Orpah Cobb.  Vegetables: Artichokes. Beets. Broccoli. Cabbage. Carrots. Eggplant. Green beans. Chard. Kale. Spinach. Onions. Leeks. Peas. Squash. Tomatoes. Peppers. Radishes.  Fruits: Apples. Apricots. Avocado. Berries. Bananas. Cherries. Dates. Figs. Grapes. Lemons. Melon. Oranges. Peaches. Plums. Pomegranate.  Meats and other protein foods: Beans. Almonds. Sunflower seeds. Pine nuts. Peanuts. Cod. Salmon. Scallops. Shrimp. Tuna. Tilapia. Clams. Oysters. Eggs.  Dairy: Low-fat milk. Cheese. Greek yogurt.  Beverages: Water. Red wine. Herbal tea.  Fats and  oils: Extra virgin olive oil. Avocado oil. Grape seed oil.  Sweets and desserts: [place] yogurt with honey. Baked apples. Poached pears. Trail mix.  Seasoning and other foods: Basil. Cilantro. Coriander. Cumin. Mint. Parsley. Sage. Rosemary. Tarragon. Garlic. Oregano. Thyme. Pepper. Balsalmic vinegar. Tahini. Hummus. Tomato sauce. Olives. Mushrooms. ? Limit these  Grains: Prepackaged pasta or rice dishes. Prepackaged cereal with added sugar.  Vegetables: Deep fried potatoes (french fries).  Fruits: Fruit canned in syrup.  Meats and other protein foods: Beef. Pork. Lamb. Poultry with skin. Hot dogs. Tomasa BlaseBacon.  Dairy: Ice cream. Sour cream. Whole milk.  Beverages: Juice. Sugar-sweetened soft drinks. Beer. Liquor and spirits.  Fats and oils: Butter. Canola oil. Vegetable oil. Beef fat (tallow). Lard.  Sweets and desserts: Cookies. Cakes. Pies. Candy.  Seasoning and other foods: Mayonnaise. Premade sauces and marinades. The items listed may not be a complete list. Talk with your dietitian about what dietary choices are right for you. Summary  The Mediterranean diet includes both food and lifestyle choices.  Eat a variety of fresh fruits and vegetables, beans, nuts, seeds, and whole grains.  Limit  the amount of red meat and sweets that you eat.  Talk with your health care provider about whether it is safe for you to drink red wine in moderation. This means 1 glass a day for nonpregnant women and 2 glasses a day for men. A glass of wine equals 5 oz (150 mL). This information is not intended to replace advice given to you by your health care provider. Make sure you discuss any questions you have with your health care provider. Document Released: 04/07/2016 Document Revised: 04/14/2016 Document Reviewed: 04/07/2016 Elsevier Patient Education  2020 [employer] Health Maintenance, Female Adopting a healthy lifestyle and getting preventive care are important in promoting health and wellness. Ask your health care provider about:  The right schedule for you to have regular tests and exams.  Things you can do on your own to prevent diseases and keep yourself healthy. What should I know about diet, weight, and exercise? Eat a healthy diet   Eat a diet that includes plenty of vegetables, fruits, low-fat dairy products, and lean protein.  Do not eat a lot of foods that are high in solid fats, added sugars, or sodium. Maintain a healthy weight Body mass index (BMI) is used to identify weight problems. It estimates body fat based on height and weight. Your health care provider can help determine your BMI and help you achieve or maintain a healthy weight. Get regular exercise Get regular exercise. This is one of the most important things you can do for your health. Most adults should:  Exercise for at least 150 minutes each week. The exercise should increase your heart rate and make you sweat (moderate-intensity exercise).  Do strengthening exercises at least twice a week. This is in addition to the moderate-intensity exercise.  Spend less time sitting. Even light physical activity can be beneficial. Watch cholesterol and blood lipids Have your blood tested for lipids and cholesterol at 3520  years of age, then have this test every 5 years. Have your cholesterol levels checked more often if:  Your lipid or cholesterol levels are high.  You are older than 29 years of age.  You are at high risk for heart disease. What should I know about cancer screening? Depending on your health history and family history, you may need to have cancer screening at various ages. This may include screening for:  Breast cancer.  Cervical cancer.  Colorectal cancer.  Skin cancer.  Lung cancer. What should I know about heart disease, diabetes, and high blood pressure? Blood pressure and heart disease  High blood pressure causes heart disease and increases the risk of stroke. This is more likely to develop in people who have high blood pressure readings, are of African descent, or are overweight.  Have your blood pressure checked: ? Every 3-5 years if you are 19-57 years of age. ? Every year if you are 67 years old or older. Diabetes Have regular diabetes screenings. This checks your fasting blood sugar level. Have the screening done:  Once every three years after age 58 if you are at a normal weight and have a low risk for diabetes.  More often and at a younger age if you are overweight or have a high risk for diabetes. What should I know about preventing infection? Hepatitis B If you have a higher risk for hepatitis B, you should be screened for this virus. Talk with your health care provider to find out if you are at risk for hepatitis B infection. Hepatitis C Testing is recommended for:  Everyone born from [birth year] through 1966.  Anyone with known risk factors for hepatitis C. Sexually transmitted infections (STIs)  Get screened for STIs, including gonorrhea and chlamydia, if: ? You are sexually active and are younger than 29 years of age. ? You are older than 29 years of age and your health care provider tells you that you are at risk for this type of infection. ? Your sexual  activity has changed since you were last screened, and you are at increased risk for chlamydia or gonorrhea. Ask your health care provider if you are at risk.  Ask your health care provider about whether you are at high risk for HIV. Your health care provider may recommend a prescription medicine to help prevent HIV infection. If you choose to take medicine to prevent HIV, you should first get tested for HIV. You should then be tested every 3 months for as long as you are taking the medicine. Pregnancy  If you are about to stop having your period (premenopausal) and you may become pregnant, seek counseling before you get pregnant.  Take 400 to 800 micrograms (mcg) of folic acid every day if you become pregnant.  Ask for birth control (contraception) if you want to prevent pregnancy. Osteoporosis and menopause Osteoporosis is a disease in which the bones lose minerals and strength with aging. This can result in bone fractures. If you are 21 years old or older, or if you are at risk for osteoporosis and fractures, ask your health care provider if you should:  Be screened for bone loss.  Take a calcium or vitamin D supplement to lower your risk of fractures.  Be given hormone replacement therapy (HRT) to treat symptoms of menopause. Follow these instructions at home: Lifestyle  Do not use any products that contain nicotine or tobacco, such as cigarettes, e-cigarettes, and chewing tobacco. If you need help quitting, ask your health care provider.  Do not use street drugs.  Do not share needles.  Ask your health care provider for help if you need support or information about quitting drugs. Alcohol use  Do not drink alcohol if: ? Your health care provider tells you not to drink. ? You are pregnant, may be pregnant, or are planning to become pregnant.  If you drink alcohol: ? Limit how much you use to 0-1 drink a day. ?  Limit intake if you are breastfeeding.  Be aware of how much  alcohol is in your drink. In the [place] one drink equals one 12 oz bottle of beer (355 mL), one 5 oz glass of wine (148 mL), or one 1 oz glass of hard liquor (44 mL). General instructions  Schedule regular health, dental, and eye exams.  Stay current with your vaccines.  Tell your health care provider if: ? You often feel depressed. ? You have ever been abused or do not feel safe at home. Summary  Adopting a healthy lifestyle and getting preventive care are important in promoting health and wellness.  Follow your health care provider's instructions about healthy diet, exercising, and getting tested or screened for diseases.  Follow your health care provider's instructions on monitoring your cholesterol and blood pressure. This information is not intended to replace advice given to you by your health care provider. Make sure you discuss any questions you have with your health care provider. Document Released: 02/28/2011 Document Revised: 08/08/2018 Document Reviewed: 08/08/2018 Elsevier Patient Education  2020 Elsevier Inc.   Diet for Polycystic Ovary Syndrome Polycystic ovary syndrome (PCOS) is a disorder of the chemicals (hormones) that regulate a woman's reproductive system, including monthly periods (menstruation). The condition causes important hormones to be out of balance. PCOS can:  Stop your periods or make them irregular.  Cause cysts to develop on your ovaries.  Make it difficult to get pregnant.  Stop your body from responding to the effects of insulin (insulin resistance). Insulin resistance can lead to obesity and diabetes. Changing what you eat can help you manage PCOS and improve your health. Following a balanced diet can help you lose weight and improve the way that your body uses insulin. What are tips for following this plan?  Follow a balanced diet for meals and snacks. Eat breakfast, lunch, dinner, and one or two snacks every day.  Include protein in each  meal and snack.  Choose whole grains instead of products that are made with refined flour.  Eat a variety of foods.  Exercise regularly as told by your health care provider. Aim to do 30 or more minutes of exercise on most days of the week.  If you are overweight or obese: ? Pay attention to how many calories you eat. Cutting down on calories can help you lose weight. ? Work with your health care provider or a diet and nutrition specialist (dietitian) to figure out how many calories you need each day. What foods can I eat?  Fruits Include a variety of colors and types. All fruits are helpful for PCOS. Vegetables Include a variety of colors and types. All vegetables are helpful for PCOS. Grains Whole grains, such as whole wheat. Whole-grain breads, crackers, cereals, and pasta. Unsweetened oatmeal, bulgur, barley, quinoa, and brown rice. Tortillas made from corn or whole-wheat flour. Meats and other proteins Low-fat (lean) proteins, such as fish, chicken, beans, eggs, and tofu. Dairy Low-fat dairy products, such as skim milk, cheese sticks, and yogurt. Beverages Low-fat or fat-free drinks, such as water, low-fat milk, sugar-free drinks, and small amounts of 100% fruit juice. Seasonings and condiments Ketchup. Mustard. Barbecue sauce. Relish. Low-fat or fat-free mayonnaise. Fats and oils Olive oil or canola oil. Walnuts and almonds. The items listed above may not be a complete list of recommended foods and beverages. Contact a dietitian for more options. What foods are not recommended? Foods that are high in calories or fat. Fried foods. Sweets. Products that are  made from refined white flour, including white bread, pastries, white rice, and pasta. The items listed above may not be a complete list of foods and beverages to avoid. Contact a dietitian for more information. Summary  PCOS is a hormonal imbalance that affects a woman's reproductive system.  You can help to manage your  PCOS by exercising regularly and eating a healthy, varied diet of vegetables, fruit, whole grains, low-fat (lean) protein, and low-fat dairy products.  Changing what you eat can improve the way that your body uses insulin, help your hormones reach normal levels, and help you lose weight. This information is not intended to replace advice given to you by your health care provider. Make sure you discuss any questions you have with your health care provider. Document Released: 12/07/2015 Document Revised: 12/05/2018 Document Reviewed: 06/19/2017 Elsevier Patient Education  2020 [employer].

## 2019-03-05 ENCOUNTER — Encounter: Payer: Self-pay | Admitting: Advanced Practice Midwife

## 2019-03-05 NOTE — Progress Notes (Addendum)
Gynecology Annual Exam   PCP: [facility], [facility]  Chief Complaint:  Chief Complaint  Patient presents with  . Annual Exam  . Vaginitis    History of Present Illness: Patient is a 28 y.o. [id number] presents for annual exam. The patient has complaint today of yeast infection after recent course of antibiotics. She had a tooth pulled last week and was taking amoxicillin. She then developed thick white discharge with itching. Discussed yeast as a common reaction to taking antibiotics. She tried monistat but that has not helped. She requests rx diflucan.   Patient has irregular periods since discontinuing Depo 1 year ago. Her periods are every 2 weeks to every 2 months. Bleeding time ranges from 7 days to 20 days and it is heavy. She has noticed female pattern hair growth on her face in the last year and is wondering if she possibly has PCOS. She denies any pain in her lower abdomen/pelvis. Discussed testing including imaging as needed and that we will start with lab work.  For the  Past 2 years the patient has been having bloating and upper middle abdominal pain when she eats. "I blow up like I'm pregnant every time I eat". She denies dietary changes or changes to bowel habits.  Discussed referral to GI to evaluate upper abdominal pain.   LMP: Patient's last menstrual period was 02/04/2019.  Clots: no Intermenstrual Bleeding: yes Postcoital Bleeding: no Dysmenorrhea: no  The patient is sexually active. She currently uses condoms for contraception. She denies dyspareunia.  The patient does perform self breast exams.  There is no notable family history of breast or ovarian cancer in her family.  The patient wears seatbelts: yes.   The patient has regular exercise: She walks and lifts at her job at [employer]. She admits a healthy diet and adequate hydration. She admits 5 hours sleep per night.    The patient denies current symptoms of depression.    Review of Systems:  Review of Systems  Constitutional: Negative.   HENT: Negative.   Eyes: Negative.   Respiratory: Negative.   Cardiovascular: Negative.   Gastrointestinal: Positive for abdominal pain.       Bloating  Genitourinary:       Vaginal discharge, vaginal itching  Musculoskeletal: Negative.   Skin: Negative.   Neurological: Negative.   Endo/Heme/Allergies:       Female pattern facial hair  Psychiatric/Behavioral: Negative.     Past Medical History:  Past Medical History:  Diagnosis Date  . Miscarriage     Past Surgical History:  Past Surgical History:  Procedure Laterality Date  . DILATION AND CURETTAGE OF UTERUS    . NO PAST SURGERIES      Gynecologic History:  Patient's last menstrual period was 02/04/2019. Contraception: condoms Last Pap: unknown per patient Results were: no abnormalities   Obstetric History: [id number]  Family History:  Family History  Problem Relation Age of Onset  . Colon cancer Maternal Grandmother     Social History:  Social History   Socioeconomic History  . Marital status: Single    Spouse name: Not on file  . Number of children: Not on file  . Years of education: Not on file  . Highest education level: Not on file  Occupational History  . Not on file  Social Needs  . Financial resource strain: Not on file  . Food insecurity    Worry: Not on file    Inability: Not on file  .  Transportation needs    Medical: Not on file    Non-medical: Not on file  Tobacco Use  . Smoking status: Never Smoker  . Smokeless tobacco: Never Used  Substance and Sexual Activity  . Alcohol use: Yes    Comment: occasional  . Drug use: No  . Sexual activity: Not Currently    Birth control/protection: None  Lifestyle  . Physical activity    Days per week: Not on file    Minutes per session: Not on file  . Stress: Not on file  Relationships  . Social Musicianconnections    Talks on phone: Not on file    Gets together: Not on file    Attends religious service:  Not on file    Active member of club or organization: Not on file    Attends meetings of clubs or organizations: Not on file    Relationship status: Not on file  . Intimate partner violence    Fear of current or ex partner: Not on file    Emotionally abused: Not on file    Physically abused: Not on file    Forced sexual activity: Not on file  Other Topics Concern  . Not on file  Social History Narrative  . Not on file    Allergies:  Allergies  Allergen Reactions  . Cherry Hives    Hives and swelling  . Latex     Medications: Prior to Admission medications   Medication Sig Start Date End Date Taking? Authorizing Provider  amoxicillin (AMOXIL) 500 MG tablet TAKE 1 TABLET BY MOUTH 3 TIMES A DAY FOR 7 DAYS 02/21/19   [provider]  fluticasone (FLONASE) 50 MCG/ACT nasal spray Place 2 sprays into both nostrils daily. 05/16/17 05/16/18  Enid DerryWagner, Ashley, PA-C    Physical Exam Vitals: Blood pressure 114/74, height 5\' 2"  (1.575 m), weight 152 lb (68.9 kg), last menstrual period 02/04/2019, unknown if currently breastfeeding.  General: NAD HEENT: normocephalic, anicteric, female pattern facial hair present on upper lip and chin Thyroid: no enlargement, no palpable nodules Pulmonary: No increased work of breathing, CTAB Cardiovascular: RRR, distal pulses 2+ Breast: Breast symmetrical, no tenderness, no palpable nodules or masses, no skin or nipple retraction present, no nipple discharge.  No axillary or supraclavicular lymphadenopathy. Abdomen: NABS, soft, non-tender, non-distended.  Umbilicus without lesions.  No hepatomegaly, splenomegaly or masses palpable. No evidence of hernia  Genitourinary:  External: Normal external female genitalia.  Normal urethral meatus, normal Bartholin's and Skene's glands.    Vagina: Normal vaginal mucosa, no evidence of prolapse.    Cervix: Grossly normal in appearance, no bleeding  Uterus: Non-enlarged, mobile, normal contour.  No CMT  Adnexa:  ovaries non-enlarged, no adnexal masses  Rectal: deferred  Lymphatic: no evidence of inguinal lymphadenopathy Extremities: no edema, erythema, or tenderness Neurologic: Grossly intact Psychiatric: mood appropriate, affect full   Assessment: 28 y.o. [id number] routine annual exam  Plan: Problem List Items Addressed This Visit    None    Visit Diagnoses    Well woman exam with routine gynecological exam    -  Primary   Relevant Orders   17-Hydroxyprogesterone   Thyroid Panel With TSH   Testosterone   Insulin, random   DHEA-sulfate   FSH/LH   STD Panel   Cytology - PAP   Screen for sexually transmitted diseases       Relevant Orders   STD Panel   Cytology - PAP   Cervical cancer screening  Relevant Orders   Cytology - PAP   Female hirsutism       Relevant Orders   17-Hydroxyprogesterone   Thyroid Panel With TSH   Testosterone   Insulin, random   DHEA-sulfate   FSH/LH   Pain of upper abdomen       Relevant Orders   Ambulatory referral to Gastroenterology   Yeast infection          1) STI screening  was offered and accepted  2)  ASCCP guidelines and rationale discussed.  Patient opts for every 3 years screening interval  3) Contraception - the patient is currently using  condoms.  She is happy with her current form of contraception and plans to continue  4) Routine healthcare maintenance including cholesterol, diabetes screening discussed Declines.   5) Hirsutism: labs ordered  6) Return in about 1 year (around 03/03/2020) for annual established gyn.   Tresea MallJane Scottlynn Lindell, CNM [facility] 03/05/2019, 9:41 AM

## 2019-03-06 ENCOUNTER — Other Ambulatory Visit: Payer: Self-pay

## 2019-03-06 ENCOUNTER — Encounter: Payer: Self-pay | Admitting: Gastroenterology

## 2019-03-06 ENCOUNTER — Ambulatory Visit: Payer: Medicaid Other | Admitting: Gastroenterology

## 2019-03-06 VITALS — BP 103/70 | HR 71 | Temp 98.2°F | Ht 63.0 in | Wt 153.8 lb

## 2019-03-06 DIAGNOSIS — R1013 Epigastric pain: Secondary | ICD-10-CM | POA: Diagnosis not present

## 2019-03-06 DIAGNOSIS — R1084 Generalized abdominal pain: Secondary | ICD-10-CM

## 2019-03-06 DIAGNOSIS — K625 Hemorrhage of anus and rectum: Secondary | ICD-10-CM | POA: Diagnosis not present

## 2019-03-06 MED ORDER — NA SULFATE-K SULFATE-MG SULF 17.5-3.13-1.6 GM/177ML PO SOLN: 1.0000 | Freq: Once | ORAL | 0 refills | Status: AC

## 2019-03-06 NOTE — Progress Notes (Signed)
Paige [name] [address]  [place]  [place],  [address]  Main: [phone]  Fax: [phone]   Gastroenterology Consultation  Referring Provider:     Rod Can, CNM Primary Care Physician:  Center, [facility] Reason for Consultation:     Abdominal pain, bloating        HPI:    Chief Complaint  Patient presents with  . Abdominal Pain    Paige [name] is a 28 y.o. y/o female referred for consultation & management  by Dr. Domingo Madeira, [facility].  Patient reports 2-year history of intermittent abdominal pain and bloating.  States occurs with or without eating with no specific diet triggers.  Occurs even after liquids.  States no matter what she eats she has midepigastric abdominal pain and associated bloating and abdominal distention.  No nausea or vomiting.  It does not specifically occur with weight or dairy products.  She states she tries to eat healthy with fruits and vegetables and grilled meats.  Also does report intermittent bright red blood per rectum.  States she feels a hemorrhoid, in and out at times.  States since her son was born her bowel movements have not been regular.  Sometimes she goes 3 days without having a bowel movement and then will have 1 every day for a week.  Does strain at times and that is when she sees the blood.  Grandmother had colon cancer.  No prior EGD or colonoscopy.  No dysphagia or heartburn  Past Medical History:  Diagnosis Date  . Miscarriage     Past Surgical History:  Procedure Laterality Date  . DILATION AND CURETTAGE OF UTERUS    . NO PAST SURGERIES      Prior to Admission medications   Medication Sig Start Date End Date Taking? Authorizing Provider  Na Sulfate-K Sulfate-Mg Sulf 17.5-3.13-1.6 GM/177ML SOLN Take 1 kit by mouth once for 1 dose. 03/06/19 03/06/19  Virgel Manifold, MD    Family History  Problem Relation Age of Onset  . Colon cancer Maternal Grandmother     Social History   Tobacco Use  . Smoking status: Never Smoker  . Smokeless tobacco: Never Used  Substance Use Topics  . Alcohol use: Not Currently    Comment: occasional  . Drug use: Yes    Types: Marijuana    Comment: occasional    Allergies as of 03/06/2019 - Review Complete 03/06/2019  Allergen Reaction Noted  . Everly 02/12/2019  . Latex  08/27/2016    Review of Systems:    All systems reviewed and negative except where noted in HPI.   Physical Exam:  BP 103/70   Pulse 71   Temp 98.2 F (36.8 C) (Oral)   Ht '5\' 3"'$  (1.6 m)   Wt 153 lb 12.8 oz (69.8 kg)   LMP 02/04/2019   BMI 27.24 kg/m  Patient's last menstrual period was 02/04/2019. Psych:  Alert and cooperative. Normal mood and affect. General:   Alert,  Well-developed, well-nourished, pleasant and cooperative in NAD Head:  Normocephalic and atraumatic. Eyes:  Sclera clear, no icterus.   Conjunctiva pink. Ears:  Normal auditory acuity. Nose:  No deformity, discharge, or lesions. Mouth:  No deformity or lesions,oropharynx pink & moist. Neck:  Supple; no masses or thyromegaly. Abdomen:  Normal bowel sounds.  No bruits.  Soft, non-tender and non-distended without masses, hepatosplenomegaly or hernias noted.  No guarding or rebound tenderness.    Msk:  Symmetrical without gross deformities.  Good, equal movement & strength bilaterally. Pulses:  Normal pulses noted. Extremities:  No clubbing or edema.  No cyanosis. Neurologic:  Alert and oriented x3;  grossly normal neurologically. Skin:  Intact without significant lesions or rashes. No jaundice. Lymph Nodes:  No significant cervical adenopathy. Psych:  Alert and cooperative. Normal mood and affect.   Labs: CBC    Component Value Date/Time   WBC 9.1 08/27/2016 1038   RBC 4.47 08/27/2016 1038   HGB 13.5 08/27/2016 1038   HGB 12.9 11/19/2014 1115   HCT 38.9 08/27/2016 1038   HCT 38.7 11/19/2014 1115   PLT 300 08/27/2016 1038   PLT 291 11/19/2014 1115    MCV 86.9 08/27/2016 1038   MCV 87 11/19/2014 1115   MCH 30.2 08/27/2016 1038   MCHC 34.8 08/27/2016 1038   RDW 12.9 08/27/2016 1038   RDW 13.4 11/19/2014 1115   CMP     Component Value Date/Time   NA 137 11/19/2014 1115   K 3.4 (L) 11/19/2014 1115   CL 104 11/19/2014 1115   CO2 26 11/19/2014 1115   GLUCOSE 75 11/19/2014 1115   BUN 11 11/19/2014 1115   CREATININE 0.52 11/19/2014 1115   CALCIUM 8.8 (L) 11/19/2014 1115   PROT 6.8 11/19/2014 1115   ALBUMIN 3.7 11/19/2014 1115   AST 18 11/19/2014 1115   ALT 16 11/19/2014 1115   ALKPHOS 64 11/19/2014 1115   BILITOT 0.3 11/19/2014 1115   GFRNONAA >60 11/19/2014 1115   GFRAA >60 11/19/2014 1115    Imaging Studies: No results found.  Assessment and Plan:   Paige [name] is a 28 y.o. y/o female has been referred for abdominal pain  Abdominal pain possibly related to GERD versus dyspepsia We discussed the options of proceeding with H. pylori testing, starting MiraLAX daily for her constipation as that can cause her abdominal pain and distention, and trying a FODMAP diet and lactose-free diet as well.  However, patient would like to proceed with endoscopy at this time as well due to ongoing symptoms for 2 years instead of just conservative management  She will be given a FODMAP diet handout Have also asked her to avoid dairy products for 2 weeks to see if this helps  High-fiber diet MiraLAX or Metamucil daily with goal of 1-2 soft bowel movements daily.  If not at goal, patient instructed to increase dose to twice daily.  If loose stools with the medication, patient asked to decrease the medication to every other day, or half dose daily.  Patient verbalized understanding  Will obtain biopsies for H. pylori during her EGD  We will rule out large polyps or cancers with her colonoscopy due to her intermittent rectal bleeding  I have discussed alternative options, risks & benefits,  which include, but are not limited to, bleeding,  infection, perforation,respiratory complication & drug reaction.  The patient agrees with this plan & written consent will be obtained.     Dr Paige [name]  Speech recognition software was used to dictate the above note.

## 2019-03-06 NOTE — Patient Instructions (Signed)
Use Miralax 1 to 2 capfuls daily as directed by Dr. Bonna Gains

## 2019-03-07 LAB — CYTOLOGY - PAP
Chlamydia: NEGATIVE
Diagnosis: NEGATIVE
Neisseria Gonorrhea: NEGATIVE
Trichomonas: NEGATIVE

## 2019-03-08 LAB — 17-HYDROXYPROGESTERONE: 17-Hydroxyprogesterone: 173 ng/dL

## 2019-03-08 LAB — THYROID PANEL WITH TSH
Free Thyroxine Index: 1.7 (ref 1.2–4.9)
T3 Uptake Ratio: 24 % (ref 24–39)
T4, Total: 7.1 ug/dL (ref 4.5–12.0)
TSH: 0.531 u[IU]/mL (ref 0.450–4.500)

## 2019-03-08 LAB — RPR+HSVIGM+HBSAG+HSV2(IGG)+...
HIV Screen 4th Generation wRfx: NONREACTIVE
HSV 2 IgG, Type Spec: <0.91 index (ref 0.00–0.90)
HSVI/II Comb IgM: 1.12 Ratio — ABNORMAL HIGH (ref 0.00–0.90)
Hepatitis B Surface Ag: NEGATIVE
RPR Ser Ql: NONREACTIVE

## 2019-03-08 LAB — INSULIN, RANDOM: INSULIN: 10.4 u[IU]/mL (ref 2.6–24.9)

## 2019-03-08 LAB — DHEA-SULFATE: DHEA-SO4: 604.3 ug/dL — ABNORMAL HIGH (ref 84.8–378.0)

## 2019-03-08 LAB — TESTOSTERONE: Testosterone: 91 ng/dL — ABNORMAL HIGH (ref 8–48)

## 2019-03-08 LAB — FSH/LH
FSH: 6.5 m[IU]/mL
LH: 17.9 m[IU]/mL

## 2019-03-15 NOTE — Addendum Note (Signed)
Addended by: Rod Can on: 03/15/2019 06:02 PM   Modules accepted: Orders

## 2019-03-15 NOTE — Progress Notes (Signed)
Discussed preliminary lab results with patient.   Plan: Return for gyn u/s Labs: Hgb A1C Cholesterol TSH Prolactin Testosterone free and total  Will repeat DHEAs at future visit  Recommended possible treatments:  Aldactone Combined OCP  Laser hair removal  Rod Can, CNM

## 2019-03-18 ENCOUNTER — Other Ambulatory Visit: Admission: RE | Admit: 2019-03-18 | Payer: Medicaid Other | Source: Ambulatory Visit

## 2019-03-19 ENCOUNTER — Other Ambulatory Visit: Admission: RE | Admit: 2019-03-19 | Payer: Medicaid Other | Source: Ambulatory Visit

## 2019-03-22 ENCOUNTER — Ambulatory Visit: Admission: RE | Admit: 2019-03-22 | Payer: Medicaid Other | Source: Home / Self Care | Admitting: Gastroenterology

## 2019-03-22 ENCOUNTER — Encounter: Admission: RE | Payer: Self-pay | Source: Home / Self Care

## 2019-03-22 SURGERY — COLONOSCOPY WITH PROPOFOL
Anesthesia: General

## 2019-03-27 ENCOUNTER — Other Ambulatory Visit: Payer: Self-pay

## 2019-03-27 ENCOUNTER — Ambulatory Visit (INDEPENDENT_AMBULATORY_CARE_PROVIDER_SITE_OTHER): Payer: Medicaid Other

## 2019-03-27 ENCOUNTER — Ambulatory Visit (INDEPENDENT_AMBULATORY_CARE_PROVIDER_SITE_OTHER): Payer: Medicaid Other | Admitting: Advanced Practice Midwife

## 2019-03-27 VITALS — BP 122/74 | Ht 62.0 in | Wt 151.0 lb

## 2019-03-27 DIAGNOSIS — N939 Abnormal uterine and vaginal bleeding, unspecified: Secondary | ICD-10-CM | POA: Diagnosis not present

## 2019-03-27 DIAGNOSIS — N926 Irregular menstruation, unspecified: Secondary | ICD-10-CM

## 2019-03-27 DIAGNOSIS — E282 Polycystic ovarian syndrome: Secondary | ICD-10-CM

## 2019-03-27 DIAGNOSIS — Z09 Encounter for follow-up examination after completed treatment for conditions other than malignant neoplasm: Secondary | ICD-10-CM | POA: Diagnosis not present

## 2019-03-27 DIAGNOSIS — L680 Hirsutism: Secondary | ICD-10-CM | POA: Diagnosis not present

## 2019-03-28 ENCOUNTER — Other Ambulatory Visit: Payer: Self-pay | Admitting: Advanced Practice Midwife

## 2019-03-28 ENCOUNTER — Telehealth: Payer: Self-pay | Admitting: Advanced Practice Midwife

## 2019-03-28 DIAGNOSIS — E282 Polycystic ovarian syndrome: Secondary | ICD-10-CM

## 2019-03-28 NOTE — Progress Notes (Unsigned)
Lab orders placed: Prolactin Testosterone free and total Hgb A1C DHEAs Cholesterol  Follow up after labs result

## 2019-03-28 NOTE — Telephone Encounter (Signed)
Spoke with patient regarding results earlier today. Will plan for a few more diagnostic labs and follow up after that.

## 2019-03-28 NOTE — Telephone Encounter (Signed)
Patient calling needing results for ultrasound. Patient was seen yesterday for ultrasound and follow up but never saw the provider or had a call for results. Per Dr. Gilman Schmidt reviewed ultrasound stating normal ultrasound and Paige [name] will call with more information about ultrasound. per Patient "anxiety very high and didn't want to wait for call". Patient grateful for information and waiting for more information from [place].

## 2019-03-29 DIAGNOSIS — E282 Polycystic ovarian syndrome: Secondary | ICD-10-CM | POA: Insufficient documentation

## 2019-03-29 NOTE — Progress Notes (Signed)
Patient ID: Paige [name], female   DOB: [birth year], 28 y.o.   MRN: [id number]  Reason for Consult: Follow-up (U/S Follow Up)   Date of Service: 03/27/2019 Subjective:     HPI:  Paige [name] is a 28 y.o. female being seen for follow up visit, gyn ultrasound, symptoms of polycystic ovarian syndrome.   Discussed results of ultrasound and recent labs. Clinical picture shows likely PCOS with hirsutism. We talked about a few other diagnostic labs to check for insulin resistance and other metabolic issues. We also discussed the various treatment options- depending on desired outcome, including healthy lifestyle, weight loss, hormones for cycle regulation, ovulation stimulating medications, laser treatment for hair removal. The patient is interested in completing the lab work and would just like to work on healthy lifestyle at this time.   Past Medical History:  Diagnosis Date  . Miscarriage    Family History  Problem Relation Age of Onset  . Colon cancer Maternal Grandmother    Past Surgical History:  Procedure Laterality Date  . DILATION AND CURETTAGE OF UTERUS    . NO PAST SURGERIES      Short Social History:  Social History   Tobacco Use  . Smoking status: Never Smoker  . Smokeless tobacco: Never Used  Substance Use Topics  . Alcohol use: Not Currently    Comment: occasional    Allergies  Allergen Reactions  . Cherry Hives    Hives and swelling  . Latex     No current outpatient medications on file.   No current facility-administered medications for this visit.     Review of Systems  Constitutional: Negative.   HENT: Negative.   Eyes: Negative.   Respiratory: Negative.   Cardiovascular: Negative.   Gastrointestinal: Negative.   Genitourinary:       Irregular menstrual periods   Musculoskeletal: Negative.   Skin: Negative.   Neurological: Negative.   Endo/Heme/Allergies:       Female pattern hair growth   Psychiatric/Behavioral: Negative.          Objective:  Objective   Vitals:   03/27/19 1547  BP: 122/74  Weight: 151 lb (68.5 kg)  Height: 5\' 2"  (1.575 m)   Body mass index is 27.62 kg/m. Constitutional: Well nourished, well developed female in no acute distress.  HEENT: normal Skin: Warm and dry.  Cardiovascular: Regular rate and rhythm.    Respiratory:  Normal respiratory effort Psych: Alert and Oriented x3. No memory deficits. Normal mood and affect.  MS: normal gait, normal bilateral lower extremity ROM/strength/stability.    Data: Patient Name: Paige [name] DOB: [birth year] MRN: [id number]  ULTRASOUND REPORT  Location: [facility]  Date of Service: 03/27/2019   Indications:Abnormal Uterine Bleeding Findings:  The uterus is anteverted and measures 8.1 x 1.9 x 1.6 cm. Echo texture is homogenous without evidence of focal masses. The Endometrium measures 7.9 mm.  Right Ovary measures 4.3 x 3.1 x 2.6 cm. It is normal in appearance. Left Ovary measures 4.1 x 2.0 x 2.1 cm. It is normal in appearance. Survey of the adnexa demonstrates no adnexal masses. There is no free fluid in the cul de sac.  Impression: 1. Normal uterus and cervix.  2. The ovaries have a ring of follicles around the periphery. Probable polycystic ovaries.   Recommendations: 1.Clinical correlation with the patient's History and Physical Exam.  Deanna ArtisElyse S Fairbanks, RT   Review of ULTRASOUND.    I have personally reviewed images and report of recent ultrasound done at  [facility].    Plan of management to be discussed with CNM Lenord Carbo, MD, [facility], [facility] 03/27/2019  3:16 PM  Assessment/Plan:     28 yo G5 P1041 with likely polycystic ovary syndrome  Future labs ordered: Prolactin Free and total Testosterone Hgb A1C DHEAs Cholesterol Healthy lifestyle and diet recommendations given Follow up as needed for future treatment  [facility] 03/29/2019, 1:29 PM

## 2019-06-30 NOTE — Progress Notes (Deleted)
    Center, [employer]   No chief complaint on file.   HPI:      [name] is a 28 y.o. [phone] who LMP was No LMP recorded. (Menstrual status: Irregular Periods)., presents today for STD testing  Neg STD testing 7/20 with PCP  Patient Active Problem List   Diagnosis Date Noted  . PCOS (polycystic ovarian syndrome) 03/29/2019    Past Surgical History:  Procedure Laterality Date  . DILATION AND CURETTAGE OF UTERUS    . NO PAST SURGERIES      Family History  Problem Relation Age of Onset  . Colon cancer Maternal Grandmother     Social History   Socioeconomic History  . Marital status: Single    Spouse name: Not on file  . Number of children: Not on file  . Years of education: Not on file  . Highest education level: Not on file  Occupational History  . Not on file  Social Needs  . Financial resource strain: Not on file  . Food insecurity    Worry: Not on file    Inability: Not on file  . Transportation needs    Medical: Not on file    Non-medical: Not on file  Tobacco Use  . Smoking status: Never Smoker  . Smokeless tobacco: Never Used  Substance and Sexual Activity  . Alcohol use: Not Currently    Comment: occasional  . Drug use: Yes    Types: Marijuana    Comment: occasional  . Sexual activity: Not Currently    Birth control/protection: None  Lifestyle  . Physical activity    Days per week: Not on file    Minutes per session: Not on file  . Stress: Not on file  Relationships  . Social Herbalist on phone: Not on file    Gets together: Not on file    Attends religious service: Not on file    Active member of club or organization: Not on file    Attends meetings of clubs or organizations: Not on file    Relationship status: Not on file  . Intimate partner violence    Fear of current or ex partner: Not on file    Emotionally abused: Not on file    Physically abused: Not on file    Forced sexual activity: Not on  file  Other Topics Concern  . Not on file  Social History Narrative  . Not on file    No outpatient medications prior to visit.   No facility-administered medications prior to visit.       ROS:  Review of Systems BREAST: No symptoms   OBJECTIVE:   Vitals:  There were no vitals taken for this visit.  Physical Exam  Results: No results found for this or any previous visit (from the past 24 hour(s)).   Assessment/Plan: No diagnosis found.    No orders of the defined types were placed in this encounter.     No follow-ups on file.  Promise Bushong B. Jacee Enerson, PA-C 06/30/2019 7:49 PM

## 2019-07-01 ENCOUNTER — Ambulatory Visit: Payer: Medicaid Other | Admitting: Obstetrics and Gynecology

## 2019-07-03 ENCOUNTER — Ambulatory Visit: Payer: Medicaid Other | Admitting: Obstetrics and Gynecology

## 2019-07-03 NOTE — Progress Notes (Deleted)
    Center, [employer] [facility]   No chief complaint on file.   HPI:      [name] is a 28 y.o. [phone] who LMP was No LMP recorded. (Menstrual status: Irregular Periods)., presents today for STD testing  Neg STD testing 7/20 with PCP  Patient Active Problem List   Diagnosis Date Noted  . PCOS (polycystic ovarian syndrome) 03/29/2019    Past Surgical History:  Procedure Laterality Date  . DILATION AND CURETTAGE OF UTERUS    . NO PAST SURGERIES      Family History  Problem Relation Age of Onset  . Colon cancer Maternal Grandmother     Social History   Socioeconomic History  . Marital status: Single    Spouse name: Not on file  . Number of children: Not on file  . Years of education: Not on file  . Highest education level: Not on file  Occupational History  . Not on file  Social Needs  . Financial resource strain: Not on file  . Food insecurity    Worry: Not on file    Inability: Not on file  . Transportation needs    Medical: Not on file    Non-medical: Not on file  Tobacco Use  . Smoking status: Never Smoker  . Smokeless tobacco: Never Used  Substance and Sexual Activity  . Alcohol use: Not Currently    Comment: occasional  . Drug use: Yes    Types: Marijuana    Comment: occasional  . Sexual activity: Not Currently    Birth control/protection: None  Lifestyle  . Physical activity    Days per week: Not on file    Minutes per session: Not on file  . Stress: Not on file  Relationships  . Social Herbalist on phone: Not on file    Gets together: Not on file    Attends religious service: Not on file    Active member of club or organization: Not on file    Attends meetings of clubs or organizations: Not on file    Relationship status: Not on file  . Intimate partner violence    Fear of current or ex partner: Not on file    Emotionally abused: Not on file    Physically abused: Not on file    Forced sexual activity: Not on  file  Other Topics Concern  . Not on file  Social History Narrative  . Not on file    No outpatient medications prior to visit.   No facility-administered medications prior to visit.       ROS:  Review of Systems BREAST: No symptoms   OBJECTIVE:   Vitals:  There were no vitals taken for this visit.  Physical Exam  Results: No results found for this or any previous visit (from the past 24 hour(s)).   Assessment/Plan: No diagnosis found.    No orders of the defined types were placed in this encounter.     No follow-ups on file.  Daneka Lantigua B. Ryzen Deady, PA-C 07/03/2019 1:59 PM

## 2019-08-20 ENCOUNTER — Other Ambulatory Visit: Payer: Self-pay | Admitting: Certified Nurse Midwife

## 2019-08-20 MED ORDER — NYSTATIN 100000 UNIT/GM EX OINT: 1.0000 "application " | TOPICAL_OINTMENT | Freq: Three times a day (TID) | CUTANEOUS | 0 refills | Status: DC | PRN

## 2019-08-20 MED ORDER — FLUCONAZOLE 150 MG PO TABS: ORAL_TABLET | ORAL | 0 refills | Status: DC

## 2019-08-20 NOTE — Telephone Encounter (Signed)
Returned patient's call. LMP 23 November. 20. Has irregular menses. Can prescribe her Diflucan 150 mgm x 1 and Nystatin ointment tid prn. If symptoms persist after treatment, patient encouraged to make an appointment to be seen. Dalia Heading, CNM

## 2019-09-01 NOTE — Progress Notes (Deleted)
    Center, [employer]   No chief complaint on file.   HPI:      [name] is a 29 y.o. [phone] who LMP was No LMP recorded. (Menstrual status: Irregular Periods)., presents today for *** On diflucan/nystatin   Patient Active Problem List   Diagnosis Date Noted  . PCOS (polycystic ovarian syndrome) 03/29/2019    Past Surgical History:  Procedure Laterality Date  . DILATION AND CURETTAGE OF UTERUS    . NO PAST SURGERIES      Family History  Problem Relation Age of Onset  . Colon cancer Maternal Grandmother     Social History   Socioeconomic History  . Marital status: Single    Spouse name: Not on file  . Number of children: Not on file  . Years of education: Not on file  . Highest education level: Not on file  Occupational History  . Not on file  Tobacco Use  . Smoking status: Never Smoker  . Smokeless tobacco: Never Used  Substance and Sexual Activity  . Alcohol use: Not Currently    Comment: occasional  . Drug use: Yes    Types: Marijuana    Comment: occasional  . Sexual activity: Not Currently    Birth control/protection: None  Other Topics Concern  . Not on file  Social History Narrative  . Not on file   Social Determinants of Health   Financial Resource Strain:   . Difficulty of Paying Living Expenses: Not on file  Food Insecurity:   . Worried About Programme researcher, broadcasting/film/video in the Last Year: Not on file  . Ran Out of Food in the Last Year: Not on file  Transportation Needs:   . Lack of Transportation (Medical): Not on file  . Lack of Transportation (Non-Medical): Not on file  Physical Activity:   . Days of Exercise per Week: Not on file  . Minutes of Exercise per Session: Not on file  Stress:   . Feeling of Stress : Not on file  Social Connections:   . Frequency of Communication with Friends and Family: Not on file  . Frequency of Social Gatherings with Friends and Family: Not on file  . Attends Religious Services: Not  on file  . Active Member of Clubs or Organizations: Not on file  . Attends Banker Meetings: Not on file  . Marital Status: Not on file  Intimate Partner Violence:   . Fear of Current or Ex-Partner: Not on file  . Emotionally Abused: Not on file  . Physically Abused: Not on file  . Sexually Abused: Not on file    Outpatient Medications Prior to Visit  Medication Sig Dispense Refill  . fluconazole (DIFLUCAN) 150 MG tablet Take one tablet today. 1 tablet 0  . nystatin ointment (MYCOSTATIN) Apply 1 application topically 3 (three) times daily as needed. 30 g 0   No facility-administered medications prior to visit.      ROS:  Review of Systems BREAST: No symptoms   OBJECTIVE:   Vitals:  There were no vitals taken for this visit.  Physical Exam  Results: No results found for this or any previous visit (from the past 24 hour(s)).   Assessment/Plan: No diagnosis found.    No orders of the defined types were placed in this encounter.     No follow-ups on file.  Loye Vento B. Kampbell Holaway, PA-C 09/01/2019 8:47 PM

## 2019-09-02 ENCOUNTER — Ambulatory Visit: Payer: Medicaid Other | Admitting: Obstetrics and Gynecology

## 2019-09-02 ENCOUNTER — Encounter: Payer: Self-pay | Admitting: Obstetrics and Gynecology

## 2019-09-02 NOTE — Telephone Encounter (Signed)
Patient is reschedule to 09/09/19

## 2019-09-09 ENCOUNTER — Ambulatory Visit: Payer: Medicaid Other | Admitting: Obstetrics and Gynecology

## 2019-10-10 NOTE — Progress Notes (Signed)
Center, [facility]   Chief Complaint  Patient presents with  . STD testing  . Recurrent Skin Infections    inner thigh boil    HPI:      Paige [name] is a 29 y.o. [phone] who LMP was Patient's last menstrual period was 09/24/2019 (exact date)., presents today for STD testing, OCP start, and boil on her leg. Pt had neg STD testing/pap 7/20. No known exposures, no vag sx. Just wants to be safe. Pt has also had boil on her RT inner thigh recently. No d/c but painful and red. No fevers. Using warm compresses. Pants rub across leg at work.  Pt also wants to start OCPs for cycle control due to PCOS and BC. Did depo in past. Has hirsutism, GYN u/s and labs confirming PCOS. No hx of HTN, DVTs, migraines with aura. Has been having monthly menses recently but will then stop having her periods for many months.   Past Medical History:  Diagnosis Date  . Miscarriage     Past Surgical History:  Procedure Laterality Date  . DILATION AND CURETTAGE OF UTERUS    . NO PAST SURGERIES      Family History  Problem Relation Age of Onset  . Colon cancer Maternal Grandmother     Social History   Socioeconomic History  . Marital status: Single    Spouse name: Not on file  . Number of children: Not on file  . Years of education: Not on file  . Highest education level: Not on file  Occupational History  . Not on file  Tobacco Use  . Smoking status: Never Smoker  . Smokeless tobacco: Never Used  Substance and Sexual Activity  . Alcohol use: Not Currently    Comment: occasional  . Drug use: Yes    Types: Marijuana    Comment: occasional  . Sexual activity: Yes    Birth control/protection: None  Other Topics Concern  . Not on file  Social History Narrative  . Not on file   Social Determinants of Health   Financial Resource Strain:   . Difficulty of Paying Living Expenses: Not on file  Food Insecurity:   . Worried About Charity fundraiser in the Last Year:  Not on file  . Ran Out of Food in the Last Year: Not on file  Transportation Needs:   . Lack of Transportation (Medical): Not on file  . Lack of Transportation (Non-Medical): Not on file  Physical Activity:   . Days of Exercise per Week: Not on file  . Minutes of Exercise per Session: Not on file  Stress:   . Feeling of Stress : Not on file  Social Connections:   . Frequency of Communication with Friends and Family: Not on file  . Frequency of Social Gatherings with Friends and Family: Not on file  . Attends Religious Services: Not on file  . Active Member of Clubs or Organizations: Not on file  . Attends Archivist Meetings: Not on file  . Marital Status: Not on file  Intimate Partner Violence:   . Fear of Current or Ex-Partner: Not on file  . Emotionally Abused: Not on file  . Physically Abused: Not on file  . Sexually Abused: Not on file    Outpatient Medications Prior to Visit  Medication Sig Dispense Refill  . fluconazole (DIFLUCAN) 150 MG tablet Take one tablet today. 1 tablet 0  . nystatin ointment (MYCOSTATIN) Apply 1 application topically  3 (three) times daily as needed. 30 g 0   No facility-administered medications prior to visit.      ROS:  Review of Systems  Constitutional: Negative for fatigue, fever and unexpected weight change.  Respiratory: Negative for cough, shortness of breath and wheezing.   Cardiovascular: Negative for chest pain, palpitations and leg swelling.  Gastrointestinal: Negative for blood in stool, constipation, diarrhea, nausea and vomiting.  Endocrine: Negative for cold intolerance, heat intolerance and polyuria.  Genitourinary: Positive for menstrual problem. Negative for dyspareunia, dysuria, flank pain, frequency, genital sores, hematuria, pelvic pain, urgency, vaginal bleeding, vaginal discharge and vaginal pain.  Musculoskeletal: Negative for back pain, joint swelling and myalgias.  Skin: Positive for wound.  Neurological:  Negative for dizziness, syncope, light-headedness, numbness and headaches.  Hematological: Negative for adenopathy.  Psychiatric/Behavioral: Negative for agitation, confusion, sleep disturbance and suicidal ideas. The patient is not nervous/anxious.    BREAST: No symptoms   OBJECTIVE:   Vitals:  BP 112/80   Ht 5\' 3"  (1.6 m)   Wt 164 lb (74.4 kg)   LMP 09/24/2019 (Exact Date)   Breastfeeding No   BMI 29.05 kg/m   Physical Exam Vitals reviewed.  Constitutional:      Appearance: She is well-developed.  Pulmonary:     Effort: Pulmonary effort is normal.  Genitourinary:    General: Normal vulva.     Pubic Area: No rash.      Labia:        Right: No rash, tenderness or lesion.        Left: No rash, tenderness or lesion.      Vagina: Normal. No vaginal discharge, erythema or tenderness.     Cervix: Normal.     Uterus: Normal. Not enlarged and not tender.      Adnexa: Right adnexa normal and left adnexa normal.       Right: No mass or tenderness.         Left: No mass or tenderness.    Musculoskeletal:        General: Normal range of motion.     Cervical back: Normal range of motion.  Skin:    General: Skin is warm and dry.     Findings: Abscess present.       Neurological:     General: No focal deficit present.     Mental Status: She is alert and oriented to person, place, and time.  Psychiatric:        Mood and Affect: Mood normal.        Behavior: Behavior normal.        Thought Content: Thought content normal.        Judgment: Judgment normal.     Assessment/Plan: Cutaneous abscess of right lower extremity - Plan: doxycycline (VIBRAMYCIN) 100 MG capsule, fluconazole (DIFLUCAN) 150 MG tablet; Rx doxy/warm compresses. Nothing to I&D. F/u prn.   Screening for STD (sexually transmitted disease) - Plan: Cervicovaginal ancillary only, HIV Antibody (routine testing w rflx), RPR, Hepatitis C antibody  PCOS (polycystic ovarian syndrome) - Plan: Norethindrone  Acetate-Ethinyl Estrad-FE (MICROGESTIN 24 FE) 1-20 MG-MCG(24) tablet; PCOS discussed. Start OCPs with next menses, condoms. F/u if no menses for Rx provera.  Encounter for initial prescription of contraceptive pills - Plan: Norethindrone Acetate-Ethinyl Estrad-FE (MICROGESTIN 24 FE) 1-20 MG-MCG(24) tablet    Meds ordered this encounter  Medications  . doxycycline (VIBRAMYCIN) 100 MG capsule    Sig: Take 1 capsule (100 mg total) by mouth 2 (two) times daily for  10 days.    Dispense:  20 capsule    Refill:  0    Order Specific Question:   Supervising Provider    Answer:   Nadara Mustard [id number]  . fluconazole (DIFLUCAN) 150 MG tablet    Sig: Take 1 tablet (150 mg total) by mouth once for 1 dose.    Dispense:  1 tablet    Refill:  0    Order Specific Question:   Supervising Provider    Answer:   Nadara Mustard [id number]  . Norethindrone Acetate-Ethinyl Estrad-FE (MICROGESTIN 24 FE) 1-20 MG-MCG(24) tablet    Sig: Take 1 tablet by mouth daily.    Dispense:  84 tablet    Refill:  1    Order Specific Question:   Supervising Provider    Answer:   Nadara Mustard [[id number]]      Return if symptoms worsen or fail to improve.  Alicia B. Copland, PA-C 10/11/2019 12:01 PM

## 2019-10-11 ENCOUNTER — Other Ambulatory Visit: Payer: Self-pay

## 2019-10-11 ENCOUNTER — Ambulatory Visit: Payer: Medicaid Other | Admitting: Obstetrics and Gynecology

## 2019-10-11 ENCOUNTER — Other Ambulatory Visit (HOSPITAL_COMMUNITY)
Admission: RE | Admit: 2019-10-11 | Discharge: 2019-10-11 | Disposition: A | Discharge status: Home / Self Care | Payer: Medicaid Other | Source: Ambulatory Visit | Attending: Obstetrics and Gynecology | Admitting: Obstetrics and Gynecology

## 2019-10-11 ENCOUNTER — Encounter: Payer: Self-pay | Admitting: Obstetrics and Gynecology

## 2019-10-11 VITALS — BP 112/80 | Ht 63.0 in | Wt 164.0 lb

## 2019-10-11 DIAGNOSIS — E282 Polycystic ovarian syndrome: Secondary | ICD-10-CM

## 2019-10-11 DIAGNOSIS — L02415 Cutaneous abscess of right lower limb: Secondary | ICD-10-CM | POA: Diagnosis not present

## 2019-10-11 DIAGNOSIS — Z30011 Encounter for initial prescription of contraceptive pills: Secondary | ICD-10-CM | POA: Diagnosis not present

## 2019-10-11 DIAGNOSIS — Z113 Encounter for screening for infections with a predominantly sexual mode of transmission: Secondary | ICD-10-CM | POA: Insufficient documentation

## 2019-10-11 MED ORDER — MICROGESTIN 24 FE 1-20 MG-MCG PO TABS: 1.0000 | ORAL_TABLET | Freq: Every day | ORAL | 1 refills | Status: DC

## 2019-10-11 MED ORDER — DOXYCYCLINE HYCLATE 100 MG PO CAPS: 100.0000 mg | ORAL_CAPSULE | Freq: Two times a day (BID) | ORAL | 0 refills | Status: AC

## 2019-10-11 MED ORDER — FLUCONAZOLE 150 MG PO TABS: 150.0000 mg | ORAL_TABLET | Freq: Once | ORAL | 0 refills | Status: AC

## 2019-10-11 NOTE — Patient Instructions (Signed)
I value your feedback and entrusting us with your care. If you get a [facility] patient survey, I would appreciate you taking the time to let us know about your experience today. Thank you!  As of August 08, 2019, your lab results will be released to your MyChart immediately, before I even have a chance to see them. Please give me time to review them and contact you if there are any abnormalities. Thank you for your patience.  

## 2019-10-12 ENCOUNTER — Encounter: Payer: Self-pay | Admitting: Obstetrics and Gynecology

## 2019-10-12 LAB — HIV ANTIBODY (ROUTINE TESTING W REFLEX): HIV Screen 4th Generation wRfx: NONREACTIVE

## 2019-10-12 LAB — RPR: RPR Ser Ql: NONREACTIVE

## 2019-10-12 LAB — HEPATITIS C ANTIBODY: Hep C Virus Ab: <0.1 s/co ratio (ref 0.0–0.9)

## 2019-10-14 LAB — CERVICOVAGINAL ANCILLARY ONLY
Chlamydia: NEGATIVE
Comment: NEGATIVE
Comment: NEGATIVE
Comment: NORMAL
Neisseria Gonorrhea: NEGATIVE
Trichomonas: NEGATIVE

## 2020-01-02 NOTE — Progress Notes (Signed)
Center, [facility]   Chief Complaint  Patient presents with  . STD testing    HPI:      [name] is a 29 y.o. [phone] whose LMP was Patient's last menstrual period was 12/20/2019 (exact date)., presents today for STD tesitng. No sx or known exposures, just wants to be safe. Neg testing 2/21. She is sex active, no new partners. Using condoms.   Discussed starting OCPs at 2/21 appt but pt decided against it. Hx of PCOS but having menses monthly, lasting 7 days.   Boil on leg, treated with doxy at 2/21 appt with sx relief.   Past Medical History:  Diagnosis Date  . Miscarriage     Past Surgical History:  Procedure Laterality Date  . DILATION AND CURETTAGE OF UTERUS    . NO PAST SURGERIES      Family History  Problem Relation Age of Onset  . Colon cancer Maternal Grandmother        not sure of age    Social History   Socioeconomic History  . Marital status: Single    Spouse name: Not on file  . Number of children: Not on file  . Years of education: Not on file  . Highest education level: Not on file  Occupational History  . Not on file  Tobacco Use  . Smoking status: Never Smoker  . Smokeless tobacco: Never Used  Substance and Sexual Activity  . Alcohol use: Not Currently    Comment: occasional  . Drug use: Yes    Types: Marijuana    Comment: occasional  . Sexual activity: Yes    Birth control/protection: None  Other Topics Concern  . Not on file  Social History Narrative  . Not on file   Social Determinants of Health   Financial Resource Strain:   . Difficulty of Paying Living Expenses:   Food Insecurity:   . Worried About Programme researcher, broadcasting/film/video in the Last Year:   . Barista in the Last Year:   Transportation Needs:   . Freight forwarder (Medical):   Marland Kitchen Lack of Transportation (Non-Medical):   Physical Activity:   . Days of Exercise per Week:   . Minutes of Exercise per Session:   Stress:   . Feeling of  Stress :   Social Connections:   . Frequency of Communication with Friends and Family:   . Frequency of Social Gatherings with Friends and Family:   . Attends Religious Services:   . Active Member of Clubs or Organizations:   . Attends Banker Meetings:   Marland Kitchen Marital Status:   Intimate Partner Violence:   . Fear of Current or Ex-Partner:   . Emotionally Abused:   Marland Kitchen Physically Abused:   . Sexually Abused:     Outpatient Medications Prior to Visit  Medication Sig Dispense Refill  . Norethindrone Acetate-Ethinyl Estrad-FE (MICROGESTIN 24 FE) 1-20 MG-MCG(24) tablet Take 1 tablet by mouth daily. 84 tablet 1   No facility-administered medications prior to visit.      ROS:  Review of Systems  Constitutional: Negative for fever.  Gastrointestinal: Negative for blood in stool, constipation, diarrhea, nausea and vomiting.  Genitourinary: Negative for dyspareunia, dysuria, flank pain, frequency, hematuria, urgency, vaginal bleeding, vaginal discharge and vaginal pain.  Musculoskeletal: Negative for back pain.  Skin: Negative for rash.  BREAST: No symptoms   OBJECTIVE:   Vitals:  BP 90/70   Ht 5\' 3"  (1.6 m)  Wt 162 lb (73.5 kg)   LMP 12/20/2019 (Exact Date)   BMI 28.70 kg/m   Physical Exam Vitals reviewed.  Constitutional:      Appearance: She is well-developed.  Pulmonary:     Effort: Pulmonary effort is normal.  Genitourinary:    General: Normal vulva.     Pubic Area: No rash.      Labia:        Right: No rash, tenderness or lesion.        Left: No rash, tenderness or lesion.      Vagina: Normal. No vaginal discharge, erythema or tenderness.     Cervix: Normal.     Uterus: Normal. Not enlarged and not tender.      Adnexa: Right adnexa normal and left adnexa normal.       Right: No mass or tenderness.         Left: No mass or tenderness.    Musculoskeletal:        General: Normal range of motion.     Cervical back: Normal range of motion.  Skin:     General: Skin is warm and dry.  Neurological:     General: No focal deficit present.     Mental Status: She is alert and oriented to person, place, and time.  Psychiatric:        Mood and Affect: Mood normal.        Behavior: Behavior normal.        Thought Content: Thought content normal.        Judgment: Judgment normal.     Assessment/Plan: Screening for STD (sexually transmitted disease) - Plan: HIV Antibody (routine testing w rflx), RPR, HSV 2 antibody, IgG, HIV Antibody (routine testing w rflx), Cervicovaginal ancillary only    Return if symptoms worsen or fail to improve.  Alicha Raspberry B. Alianys Chacko, PA-C 01/03/2020 3:00 PM

## 2020-01-03 ENCOUNTER — Other Ambulatory Visit (HOSPITAL_COMMUNITY)
Admission: RE | Admit: 2020-01-03 | Discharge: 2020-01-03 | Disposition: A | Discharge status: Home / Self Care | Payer: Medicaid Other | Source: Ambulatory Visit | Attending: Obstetrics and Gynecology | Admitting: Obstetrics and Gynecology

## 2020-01-03 ENCOUNTER — Other Ambulatory Visit: Payer: Self-pay

## 2020-01-03 ENCOUNTER — Ambulatory Visit (INDEPENDENT_AMBULATORY_CARE_PROVIDER_SITE_OTHER): Payer: Medicaid Other | Admitting: Obstetrics and Gynecology

## 2020-01-03 ENCOUNTER — Encounter: Payer: Self-pay | Admitting: Obstetrics and Gynecology

## 2020-01-03 VITALS — BP 90/70 | Ht 63.0 in | Wt 162.0 lb

## 2020-01-03 DIAGNOSIS — Z113 Encounter for screening for infections with a predominantly sexual mode of transmission: Secondary | ICD-10-CM | POA: Insufficient documentation

## 2020-01-03 NOTE — Patient Instructions (Signed)
I value your feedback and entrusting us with your care. If you get a [facility] patient survey, I would appreciate you taking the time to let us know about your experience today. Thank you!  As of August 08, 2019, your lab results will be released to your MyChart immediately, before I even have a chance to see them. Please give me time to review them and contact you if there are any abnormalities. Thank you for your patience.  

## 2020-01-04 LAB — HSV 2 ANTIBODY, IGG: HSV 2 IgG, Type Spec: <0.91 index (ref 0.00–0.90)

## 2020-01-04 LAB — HIV ANTIBODY (ROUTINE TESTING W REFLEX): HIV Screen 4th Generation wRfx: NONREACTIVE

## 2020-01-04 LAB — RPR: RPR Ser Ql: NONREACTIVE

## 2020-01-06 ENCOUNTER — Encounter: Payer: Self-pay | Admitting: Obstetrics and Gynecology

## 2020-01-07 LAB — CERVICOVAGINAL ANCILLARY ONLY
Chlamydia: NEGATIVE
Comment: NEGATIVE
Comment: NEGATIVE
Comment: NORMAL
Neisseria Gonorrhea: NEGATIVE
Trichomonas: NEGATIVE

## 2020-03-10 NOTE — Progress Notes (Signed)
Center, [facility]   Chief Complaint  Patient presents with  . STD testing  . Amenorrhea    hasnt had cycle this month, unusual for pt    HPI:      [name] is a 29 y.o. [phone] whose LMP was Patient's last menstrual period was 01/31/2020 (exact date)., presents today for routine STD testing, swab only, no blood work. No vag sx/known exposures. Just wants to be safe. Neg STD testing 5/21. She is sex active, not using condoms. Declines BC.   Is late for menses this month. Usually they are monthly. Hx of PCOS.  Pt also with rectal bleeding and pain with BMs. Has had hemorrhoids since "a child" but they have been worse the past few months. She bleeds significantly with every BM (has to shower afterwards) and then notices blood on her underwear as the day goes on. Has pain in pelvis and rectum with formed BMs every AM. Sx occurred in past but less frequent. Hasn't used any meds to treat other than OTC preparation H type products. Has been too embarrassed to talk about it. FH colon cancer and hemorrhoids in her MGM. No FH IBD.  Past Medical History:  Diagnosis Date  . Miscarriage     Past Surgical History:  Procedure Laterality Date  . DILATION AND CURETTAGE OF UTERUS    . NO PAST SURGERIES      Family History  Problem Relation Age of Onset  . Colon cancer Maternal Grandmother        not sure of age    Social History   Socioeconomic History  . Marital status: Single    Spouse name: Not on file  . Number of children: Not on file  . Years of education: Not on file  . Highest education level: Not on file  Occupational History  . Not on file  Tobacco Use  . Smoking status: Never Smoker  . Smokeless tobacco: Never Used  Vaping Use  . Vaping Use: Never used  Substance and Sexual Activity  . Alcohol use: Not Currently    Comment: occasional  . Drug use: Yes    Types: Marijuana    Comment: occasional  . Sexual activity: Yes    Birth  control/protection: None  Other Topics Concern  . Not on file  Social History Narrative  . Not on file   Social Determinants of Health   Financial Resource Strain:   . Difficulty of Paying Living Expenses:   Food Insecurity:   . Worried About Programme researcher, broadcasting/film/video in the Last Year:   . Barista in the Last Year:   Transportation Needs:   . Freight forwarder (Medical):   Marland Kitchen Lack of Transportation (Non-Medical):   Physical Activity:   . Days of Exercise per Week:   . Minutes of Exercise per Session:   Stress:   . Feeling of Stress :   Social Connections:   . Frequency of Communication with Friends and Family:   . Frequency of Social Gatherings with Friends and Family:   . Attends Religious Services:   . Active Member of Clubs or Organizations:   . Attends Banker Meetings:   Marland Kitchen Marital Status:   Intimate Partner Violence:   . Fear of Current or Ex-Partner:   . Emotionally Abused:   Marland Kitchen Physically Abused:   . Sexually Abused:     No outpatient medications prior to visit.   No facility-administered medications  prior to visit.      ROS:  Review of Systems  Constitutional: Negative for fever.  Gastrointestinal: Positive for anal bleeding and rectal pain. Negative for blood in stool, constipation, diarrhea, nausea and vomiting.  Genitourinary: Positive for menstrual problem. Negative for dyspareunia, dysuria, flank pain, frequency, hematuria, urgency, vaginal bleeding, vaginal discharge and vaginal pain.  Musculoskeletal: Negative for back pain.  Skin: Negative for rash.    OBJECTIVE:   Vitals:  BP 100/80   Ht 5\' 3"  (1.6 m)   Wt 156 lb (70.8 kg)   LMP 01/31/2020 (Exact Date)   BMI 27.63 kg/m   Physical Exam Vitals reviewed.  Constitutional:      Appearance: She is well-developed.  Pulmonary:     Effort: Pulmonary effort is normal.  Genitourinary:    General: Normal vulva.     Pubic Area: No rash.      Labia:        Right: No rash,  tenderness or lesion.        Left: No rash, tenderness or lesion.      Vagina: Vaginal discharge present. No erythema or tenderness.     Cervix: Normal.     Rectum: Tenderness and external hemorrhoid present.     Comments: MULT LARGE EXT HEMORRHOIDS, 1 WITH RED, IRRITATED AREA WITH DRIED BLOOD Musculoskeletal:        General: Normal range of motion.     Cervical back: Normal range of motion.  Skin:    General: Skin is warm and dry.  Neurological:     General: No focal deficit present.     Mental Status: She is alert and oriented to person, place, and time.  Psychiatric:        Mood and Affect: Mood normal.        Behavior: Behavior normal.        Thought Content: Thought content normal.        Judgment: Judgment normal.     Results: Results for orders placed or performed in visit on 03/11/20 (from the past 24 hour(s))  POCT urine pregnancy     Status: Normal   Collection Time: 03/11/20  9:49 AM  Result Value Ref Range   Preg Test, Ur Negative Negative     Assessment/Plan: Screening for STD (sexually transmitted disease) - Plan: Cervicovaginal ancillary only; Pt only wants vag swab. Will f/u with results.   Late menses - Plan: POCT urine pregnancy; neg UPT. See if menses resume normally next month. F/u prn. If not, will check labs. Hx of PCOS. Declines BC.   Grade II hemorrhoids - Plan: hydrocortisone (ANUSOL-HC) 2.5 % rectal cream, Ambulatory referral to Gastroenterology  Rectal bleeding - Plan: hydrocortisone (ANUSOL-HC) 2.5 % rectal cream, Ambulatory referral to Gastroenterology; Rx anusol/sitz baths. Ref to GI for further eval/mgmt.    Meds ordered this encounter  Medications  . hydrocortisone (ANUSOL-HC) 2.5 % rectal cream    Sig: Place rectally 2 (two) times daily for 14 days.    Dispense:  28 g    Refill:  0    Order Specific Question:   Supervising Provider    Answer:   03/13/20 Nadara Mustard      Return if symptoms worsen or fail to improve.  Jahred Tatar  B. Ursala Cressy, PA-C 03/11/2020 12:03 PM

## 2020-03-11 ENCOUNTER — Ambulatory Visit (INDEPENDENT_AMBULATORY_CARE_PROVIDER_SITE_OTHER): Payer: Medicaid Other | Admitting: Obstetrics and Gynecology

## 2020-03-11 ENCOUNTER — Other Ambulatory Visit (HOSPITAL_COMMUNITY)
Admission: RE | Admit: 2020-03-11 | Discharge: 2020-03-11 | Disposition: A | Discharge status: Home / Self Care | Payer: Medicaid Other | Source: Ambulatory Visit | Attending: Obstetrics and Gynecology | Admitting: Obstetrics and Gynecology

## 2020-03-11 ENCOUNTER — Encounter: Payer: Self-pay | Admitting: Obstetrics and Gynecology

## 2020-03-11 ENCOUNTER — Other Ambulatory Visit: Payer: Self-pay

## 2020-03-11 VITALS — BP 100/80 | Ht 63.0 in | Wt 156.0 lb

## 2020-03-11 DIAGNOSIS — K625 Hemorrhage of anus and rectum: Secondary | ICD-10-CM | POA: Diagnosis not present

## 2020-03-11 DIAGNOSIS — N926 Irregular menstruation, unspecified: Secondary | ICD-10-CM | POA: Diagnosis not present

## 2020-03-11 DIAGNOSIS — Z113 Encounter for screening for infections with a predominantly sexual mode of transmission: Secondary | ICD-10-CM | POA: Insufficient documentation

## 2020-03-11 DIAGNOSIS — K641 Second degree hemorrhoids: Secondary | ICD-10-CM

## 2020-03-11 LAB — POCT URINE PREGNANCY: Preg Test, Ur: NEGATIVE

## 2020-03-11 MED ORDER — HYDROCORTISONE (PERIANAL) 2.5 % EX CREA: TOPICAL_CREAM | Freq: Two times a day (BID) | CUTANEOUS | 0 refills | Status: AC

## 2020-03-11 NOTE — Patient Instructions (Signed)
I value your feedback and entrusting us with your care. If you get a [facility] patient survey, I would appreciate you taking the time to let us know about your experience today. Thank you!  As of August 08, 2019, your lab results will be released to your MyChart immediately, before I even have a chance to see them. Please give me time to review them and contact you if there are any abnormalities. Thank you for your patience.  

## 2020-03-12 LAB — CERVICOVAGINAL ANCILLARY ONLY
Chlamydia: NEGATIVE
Comment: NEGATIVE
Comment: NEGATIVE
Comment: NORMAL
Neisseria Gonorrhea: NEGATIVE
Trichomonas: NEGATIVE

## 2020-03-24 ENCOUNTER — Encounter: Payer: Self-pay | Admitting: *Deleted

## 2020-06-03 ENCOUNTER — Encounter: Payer: Medicaid Other | Admitting: Advanced Practice Midwife

## 2020-08-17 ENCOUNTER — Ambulatory Visit: Payer: Medicaid Other | Admitting: Obstetrics and Gynecology

## 2020-12-09 ENCOUNTER — Ambulatory Visit (INDEPENDENT_AMBULATORY_CARE_PROVIDER_SITE_OTHER): Payer: Medicaid Other | Admitting: Advanced Practice Midwife

## 2020-12-09 ENCOUNTER — Other Ambulatory Visit (HOSPITAL_COMMUNITY)
Admission: RE | Admit: 2020-12-09 | Discharge: 2020-12-09 | Disposition: A | Discharge status: Home / Self Care | Payer: Medicaid Other | Source: Ambulatory Visit | Attending: Advanced Practice Midwife | Admitting: Advanced Practice Midwife

## 2020-12-09 ENCOUNTER — Encounter: Payer: Self-pay | Admitting: Advanced Practice Midwife

## 2020-12-09 ENCOUNTER — Other Ambulatory Visit: Payer: Self-pay

## 2020-12-09 VITALS — BP 118/70 | Ht 63.0 in | Wt 146.8 lb

## 2020-12-09 DIAGNOSIS — Z113 Encounter for screening for infections with a predominantly sexual mode of transmission: Secondary | ICD-10-CM | POA: Diagnosis present

## 2020-12-09 DIAGNOSIS — Z1159 Encounter for screening for other viral diseases: Secondary | ICD-10-CM

## 2020-12-09 NOTE — Progress Notes (Signed)
   Patient ID: Paige [name], female   DOB: Dec 11, 1990, 30 y.o.   MRN: [id number]  Reason for Consult: Gynecologic Exam (STD screening)    Subjective:  HPI:  Paige [name] is a 30 y.o. female being seen for STD testing to "be on the safe side". She and her partner took a break and then got back together. She has no specific concerns or symptoms of vaginitis/UTI.   Past Medical History:  Diagnosis Date  . Miscarriage    Family History  Problem Relation Age of Onset  . Colon cancer Maternal Grandmother        not sure of age   Past Surgical History:  Procedure Laterality Date  . DILATION AND CURETTAGE OF UTERUS    . NO PAST SURGERIES      Short Social History:  Social History   Tobacco Use  . Smoking status: Never Smoker  . Smokeless tobacco: Never Used  Substance Use Topics  . Alcohol use: Not Currently    Comment: occasional    Allergies  Allergen Reactions  . Cherry Hives    Hives and swelling  . Latex     No current outpatient medications on file.   No current facility-administered medications for this visit.    Review of Systems  Constitutional: Negative for chills and fever.  HENT: Negative for congestion, ear discharge, ear pain, hearing loss, sinus pain and sore throat.   Eyes: Negative for blurred vision and double vision.  Respiratory: Negative for cough, shortness of breath and wheezing.   Cardiovascular: Negative for chest pain, palpitations and leg swelling.  Gastrointestinal: Negative for abdominal pain, blood in stool, constipation, diarrhea, heartburn, melena, nausea and vomiting.  Genitourinary: Negative for dysuria, flank pain, frequency, hematuria and urgency.  Musculoskeletal: Negative for back pain, joint pain and myalgias.  Skin: Negative for itching and rash.  Neurological: Negative for dizziness, tingling, tremors, sensory change, speech change, focal weakness, seizures, loss of consciousness, weakness and headaches.   Endo/Heme/Allergies: Negative for environmental allergies. Does not bruise/bleed easily.  Psychiatric/Behavioral: Negative for depression, hallucinations, memory loss, substance abuse and suicidal ideas. The patient is not nervous/anxious and does not have insomnia.         Objective:  Objective   Vitals:   12/09/20 0853  BP: 118/70  Weight: 146 lb 12.8 oz (66.6 kg)  Height: 5\' 3"  (1.6 m)   Body mass index is 26 kg/m. Constitutional: Well nourished, well developed female in no acute distress.  HEENT: normal Skin: Warm and dry.  Extremity: no edema  Respiratory: Normal respiratory effort Neuro: DTRs 2+, Cranial nerves grossly intact Psych: Alert and Oriented x3. No memory deficits. Normal mood and affect.  MS: normal gait, normal bilateral lower extremity ROM/strength/stability.  Pelvic exam:  is not limited by body habitus EGBUS: within normal limits Vagina: within normal limits and with normal mucosa     Assessment/Plan:    30 y.o. G5 P1041 female in the office for STD testing  Aptima: STDs Blood work: HIV, RPR, Hepatitis panel     38 CNM [facility] 12/09/2020, 9:42 AM

## 2020-12-10 LAB — HEPATITIS PANEL, ACUTE
Hep A IgM: NEGATIVE
Hep B C IgM: NEGATIVE
Hep C Virus Ab: <0.1 s/co ratio (ref 0.0–0.9)
Hepatitis B Surface Ag: NEGATIVE

## 2020-12-10 LAB — CERVICOVAGINAL ANCILLARY ONLY
Chlamydia: NEGATIVE
Comment: NEGATIVE
Comment: NEGATIVE
Comment: NORMAL
Neisseria Gonorrhea: NEGATIVE
Trichomonas: NEGATIVE

## 2020-12-10 LAB — HIV ANTIBODY (ROUTINE TESTING W REFLEX): HIV Screen 4th Generation wRfx: NONREACTIVE

## 2020-12-10 LAB — RPR QUALITATIVE: RPR Ser Ql: NONREACTIVE

## 2021-06-11 ENCOUNTER — Ambulatory Visit: Payer: Medicaid Other | Admitting: Obstetrics and Gynecology

## 2021-09-17 ENCOUNTER — Encounter: Payer: Self-pay | Admitting: Licensed Practical Nurse

## 2021-09-17 ENCOUNTER — Other Ambulatory Visit: Payer: Self-pay

## 2021-09-17 ENCOUNTER — Ambulatory Visit (INDEPENDENT_AMBULATORY_CARE_PROVIDER_SITE_OTHER): Payer: Medicaid Other | Admitting: Licensed Practical Nurse

## 2021-09-17 VITALS — BP 126/84 | Ht 65.0 in | Wt 147.0 lb

## 2021-09-17 DIAGNOSIS — E282 Polycystic ovarian syndrome: Secondary | ICD-10-CM | POA: Diagnosis not present

## 2021-09-17 DIAGNOSIS — Z131 Encounter for screening for diabetes mellitus: Secondary | ICD-10-CM

## 2021-09-17 DIAGNOSIS — Z01419 Encounter for gynecological examination (general) (routine) without abnormal findings: Secondary | ICD-10-CM

## 2021-09-17 DIAGNOSIS — Z113 Encounter for screening for infections with a predominantly sexual mode of transmission: Secondary | ICD-10-CM

## 2021-09-17 DIAGNOSIS — Z124 Encounter for screening for malignant neoplasm of cervix: Secondary | ICD-10-CM

## 2021-09-17 NOTE — Progress Notes (Signed)
Gynecology Annual Exam   PCP: [facility]  Chief Complaint:  Chief Complaint  Patient presents with   Annual Exam   Exposure to STD    History of Present Illness: Patient is a 31 y.o. Paige [name] presents for annual exam. The patient reports concern for her mood. Was diagnosed with PCOS, has been doing a lot of reading, her mood had been up and down and wonders if it is because of PCOS.  With further discussion she states that she has excess facial hair and that is really bringing her down, every time she looks in the mirror she gets sad.  Depression score 10, GAD 7 8.   Has had bleeding from hemorrhoids x 1 month, has a BM every other day, sometimes they are hard other times they are "normal". Sometimes strains with BM. Her maternal grandmother was diagnosed with Colon Cancer at age 27.   LMP: Patient's last menstrual period was 09/01/2021. Average Interval: regular, " monthly" Duration of flow:  7-8  days Heavy Menses: yes Clots: sometimes Intermenstrual Bleeding: no Postcoital Bleeding: no Dysmenorrhea: not asked   The patient is sexually active. She currently uses none for contraception. She denies dyspareunia.  The patient does not perform self breast exams.  There is no notable family history of breast or ovarian cancer in her family.  The patient wears seatbelts: yes.   The patient has regular exercise: yes.   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    Review of Systems: Review of Systems  Constitutional: Negative.   Gastrointestinal:  Positive for blood in stool.  Genitourinary: Negative.   Endo/Heme/Allergies:        Gets overheated while sleeping Excess facial hair   Psychiatric/Behavioral:  Positive for depression. Negative for suicidal ideas.    Past Medical History:  Patient Active Problem List   Diagnosis Date Noted   Rectal bleeding 03/11/2020   PCOS  (polycystic ovarian syndrome) 03/29/2019    Past Surgical History:  Past Surgical History:  Procedure Laterality Date   DILATION AND CURETTAGE OF UTERUS     NO PAST SURGERIES      Gynecologic History:  Patient's last menstrual period was 09/01/2021. Contraception: none Last Pap: Results were: NIL and HR HPV negative   Obstetric History: [id number]  Family History:  Family History  Problem Relation Age of Onset   Colon cancer Maternal Grandmother        not sure of age    Social History:  Social History   Socioeconomic History   Marital status: Single    Spouse name: Not on file   Number of children: Not on file   Years of education: Not on file   Highest education level: Not on file  Occupational History   Not on file  Tobacco Use   Smoking status: Never   Smokeless tobacco: Never  Vaping Use   Vaping Use: Never used  Substance and Sexual Activity   Alcohol use: Not Currently    Comment: occasional   Drug use: Yes    Types: Marijuana    Comment: occasional   Sexual activity: Yes    Birth control/protection: None  Other Topics Concern   Not on file  Social History Narrative   Not on file   Social Determinants of Health   Financial Resource Strain: Not on file  Food Insecurity: Not on file  Transportation Needs: Not  on file  Physical Activity: Not on file  Stress: Not on file  Social Connections: Not on file  Intimate Partner Violence: Not on file    Allergies:  Allergies  Allergen Reactions   Cherry Hives    Hives and swelling   Latex     Medications: Prior to Admission medications   Not on File    Physical Exam Vitals: Blood pressure 126/84, height 5\' 5"  (1.651 m), weight 147 lb (66.7 kg), last menstrual period 09/01/2021.  General: NAD HEENT: normocephalic, anicteric Skin: facial hair present on upper lip, and neck  Thyroid: no enlargement, no palpable nodules Pulmonary: No increased work of breathing, CTAB Cardiovascular: RRR, distal  pulses 2+ Breast: Breast symmetrical, no tenderness, no palpable nodules or masses, no skin or nipple retraction present, no nipple discharge.  No axillary or supraclavicular lymphadenopathy. Abdomen: NABS, soft, non-tender, non-distended.  Umbilicus without lesions.  No hepatomegaly, splenomegaly or masses palpable. No evidence of hernia  Genitourinary:  External: Normal external female genitalia.  Normal urethral meatus, normal Bartholin's and Skene's glands.    Vagina: Normal vaginal mucosa, no evidence of prolapse.    Cervix: Grossly normal in appearance, no bleeding  Uterus: Non-enlarged, mobile, normal contour.  No CMT  Adnexa: ovaries non-enlarged, no adnexal masses  Rectal: deferred  Lymphatic: no evidence of inguinal lymphadenopathy Extremities: no edema, erythema, or tenderness Neurologic: Grossly intact Psychiatric: mood appropriate, affect full      Assessment: 31 y.o. 38 routine annual exam  Plan: Problem List Items Addressed This Visit       Endocrine   PCOS (polycystic ovarian syndrome)   Relevant Orders   TSH+Prl+FSH+TestT+LH+DHEA S...   [id number] PELVIS (TRANSABDOMINAL ONLY)   Ambulatory referral to Endocrinology   Other Visit Diagnoses     Cervical cancer screening    -  Primary   Relevant Orders   IGP,CtNg,AptimaHPV   Diabetes mellitus screening       Relevant Orders   Hemoglobin A1c   Screening examination for venereal disease       Relevant Orders   HEP, RPR, HIV Panel   IGP,CtNg,AptimaHPV       2) STI screening  wasoffered and accepted  2)  ASCCP guidelines and rational discussed.  Patient opts for every 5 years screening interval Pap collected today   3) Contraception - the patient is currently using  none.  She is  not interested in conceiving, assumes she cannot get pregnant because of PCOS, rec considering contraception as she does not desire an unexpected pregnancy.   4) Routine healthcare maintenance including cholesterol, diabetes  screening discussed managed by PCP  5) Depression: List of therapist given  6) PCOS-repeat labs, pelvic US, refer to endocrine  7) Rectal bleeding, rec fu with proctologist or GI consider colonoscopy, ok to discuss with PCP.   Unable to sty for labs, will return next week for labs only visit.   [place], CNM  [facility], [facility] 09/17/2021, 4:39 PM

## 2021-09-17 NOTE — Progress Notes (Signed)
Reviewed pt's history and previous labwork with Dr Jerene Pitch. Developed the following plan: -Labs are from 2020, Testerone 91-Repeat PCOS labs -Refer to Endocrine -Order pelvic US to check   TC to [place], reviewed above plan.  Will hold off on Spirlactone for now.  Rec lasor treatment for current hair growth.   [name], CNM  [name], [facility]  09/17/21  4:38 PM

## 2021-09-17 NOTE — Patient Instructions (Signed)
° °  Therapists/Counselors/Psychologists  Cari [facility] [address] [place], [place] [address] [phone]  Karen [place], [place]  & Jacqlyn Krauss [place]    [phone]        [address]       [place], [place] [address]        Ival Bible, CSW [phone] [address] [place], [place] [address]  Harle Battiest, [place]        [phone]        [address]      [place], [place] [address]        Chyrel Masson, MS [phone] [address] [place], [place] [address]   Oscar La, LMFT       [phone]        [address]       [place], [place] [address]        Felecia Jan [phone] [address] [place], [place] [address]  Lester [place]        [phone]        [address]       [place], [place] [address]        Kerin Salen [phone] [address] [place], [place] [address]  Tyron Russell, PsyD       [phone]        [address]       [place], [place] [address]        Elita Quick, LPC [phone] [address]  [place], [place] [address]   Debarah Crape        [phone]        [address] [place], [place] [address]         Rosana [facility] [phone] [email or web address]   Sation Konchella       [phone]        [address]       [place], [place] [address]        [facility] [phone] [email or web address]   ADD/ADHD Testing:   Lynetta Mare, PhD [address] [place], [place] [address] [phone] [facility] Special[facility] in [place], [place]   [place], [place] LPA [address] [place], [place] [address] Phone: [phone] Fax: [phone] [place] , [place], Medicaid   Legrand Rams [address] [place], [place] [address] [phone]    [place] [facility] in [place],  [place]   ----------------------------------------------------------   Neuropsych Testing Dr Thora Lance [address] [place], [place]: [phone] F: [phone] [email or web address]    [facility]  Lubertha Basque [place] Therapist: [phone]

## 2021-09-20 ENCOUNTER — Other Ambulatory Visit: Payer: Self-pay

## 2021-09-20 ENCOUNTER — Other Ambulatory Visit: Payer: Medicaid Other

## 2021-09-20 DIAGNOSIS — E282 Polycystic ovarian syndrome: Secondary | ICD-10-CM

## 2021-09-22 ENCOUNTER — Encounter: Payer: Self-pay | Admitting: Licensed Practical Nurse

## 2021-09-22 LAB — IGP,CTNG,APTIMAHPV
Chlamydia, Nuc. Acid Amp: NEGATIVE
Gonococcus by Nucleic Acid Amp: NEGATIVE
HPV Aptima: NEGATIVE

## 2021-09-23 NOTE — Telephone Encounter (Signed)
Pt calling; wants to know why there STD labs were not drawn/tests not done; has appt tomorrow for redraw.  [phone]

## 2021-09-24 ENCOUNTER — Other Ambulatory Visit: Payer: Medicaid Other

## 2021-09-24 ENCOUNTER — Other Ambulatory Visit: Payer: Self-pay

## 2021-09-25 LAB — TSH+PRL+FSH+TESTT+LH+DHEA S...
17-Hydroxyprogesterone: 207 ng/dL
Androstenedione: 426 ng/dL — ABNORMAL HIGH (ref 41–262)
DHEA-SO4: 586 ug/dL — ABNORMAL HIGH (ref 84.8–378.0)
FSH: 6.8 m[IU]/mL
LH: 37.2 m[IU]/mL
Prolactin: 11.8 ng/mL (ref 4.8–23.3)
TSH: 1.11 u[IU]/mL (ref 0.450–4.500)
Testosterone, Free: 5.9 pg/mL — ABNORMAL HIGH (ref 0.0–4.2)
Testosterone: 79 ng/dL — ABNORMAL HIGH (ref 8–60)

## 2021-09-25 LAB — HEP, RPR, HIV PANEL
HIV Screen 4th Generation wRfx: NONREACTIVE
Hepatitis B Surface Ag: NEGATIVE
RPR Ser Ql: NONREACTIVE

## 2021-09-25 LAB — HEMOGLOBIN A1C
Est. average glucose Bld gHb Est-mCnc: 100 mg/dL
Hgb A1c MFr Bld: 5.1 % (ref 4.8–5.6)

## 2021-09-28 ENCOUNTER — Encounter: Payer: Self-pay | Admitting: Licensed Practical Nurse

## 2021-09-29 ENCOUNTER — Ambulatory Visit: Admission: RE | Admit: 2021-09-29 | Payer: Medicaid Other | Source: Ambulatory Visit

## 2021-10-01 ENCOUNTER — Other Ambulatory Visit: Payer: Self-pay

## 2021-10-01 ENCOUNTER — Ambulatory Visit
Admission: RE | Admit: 2021-10-01 | Discharge: 2021-10-01 | Disposition: A | Discharge status: Home / Self Care | Payer: Medicaid Other | Source: Ambulatory Visit | Attending: Licensed Practical Nurse | Admitting: Licensed Practical Nurse

## 2021-10-01 DIAGNOSIS — E282 Polycystic ovarian syndrome: Secondary | ICD-10-CM | POA: Insufficient documentation

## 2021-10-06 ENCOUNTER — Telehealth: Payer: Self-pay | Admitting: Licensed Practical Nurse

## 2021-10-06 ENCOUNTER — Encounter: Payer: Self-pay | Admitting: Licensed Practical Nurse

## 2021-10-06 NOTE — Telephone Encounter (Signed)
Hello PT called and would like for you to call her back to go over her test results. Thanks

## 2021-10-07 ENCOUNTER — Encounter: Payer: Self-pay | Admitting: Licensed Practical Nurse

## 2021-10-07 ENCOUNTER — Other Ambulatory Visit: Payer: Self-pay | Admitting: Licensed Practical Nurse

## 2021-10-07 DIAGNOSIS — E282 Polycystic ovarian syndrome: Secondary | ICD-10-CM

## 2021-10-07 DIAGNOSIS — N838 Other noninflammatory disorders of ovary, fallopian tube and broad ligament: Secondary | ICD-10-CM

## 2021-10-07 NOTE — Progress Notes (Signed)
Reviewed US findings and labs with Paige [name], You most likely have PCOS. Paige [name] reports she has not received a call from endocrinology yet to make an apt. She also has not been able to find a therapist, she has called the therapists on our list, but they do not take Medicaid and seemed uninterested to trying ot help her.  Paige [name] asked if she should be concerned about the mass on her ovary, should it be removed. Reviewed [place] reports with Dr Tiburcio Pea, ok to repeat scan in 6-8 weeks.   1) Endocrine referral order placed today, Paige [name] to assist in making apt 2) Pelvic US with transvaginal ordered, to be done in about 6 weeks 3) Paige [name] messaged to reach out to Paige [name] to help find a therapist 4) Mychart message sent to Paige [name] recommending repeat US, no surgery unless pain develops.   [name], CNM  [name], [facility]  10/07/21  10:44 AM

## 2021-10-19 ENCOUNTER — Other Ambulatory Visit: Payer: Self-pay | Admitting: Licensed Practical Nurse

## 2021-10-19 DIAGNOSIS — B3731 Acute candidiasis of vulva and vagina: Secondary | ICD-10-CM

## 2021-10-19 MED ORDER — FLUCONAZOLE 150 MG PO TABS
150.0000 mg | ORAL_TABLET | ORAL | 0 refills | Status: AC
Start: 2021-10-19 — End: 2021-10-23

## 2021-10-19 NOTE — Telephone Encounter (Signed)
Please advise 

## 2021-10-19 NOTE — Progress Notes (Signed)
Pt treated with Clindamycin for a dental concern, now has a yeast infection. OTC cream not helping. Script for Diflucan sent. [name], CNM  [name], [facility]  10/19/21  10:30 AM

## 2021-10-19 NOTE — Telephone Encounter (Signed)
Can you send in a diflucan?

## 2021-11-18 ENCOUNTER — Ambulatory Visit: Admission: RE | Admit: 2021-11-18 | Payer: Medicaid Other | Source: Ambulatory Visit

## 2021-11-18 ENCOUNTER — Other Ambulatory Visit: Payer: Self-pay

## 2021-12-10 ENCOUNTER — Ambulatory Visit: Payer: Medicaid Other | Admitting: Internal Medicine

## 2021-12-10 NOTE — Progress Notes (Deleted)
? ? ?Name: Paige [name]  ?MRN/ DOB: [id number], [birth year]    ?Age/ Sex: 31 y.o., female   ? ?PCP: [facility]   ?Reason for Endocrinology Evaluation: PCOS  ?   ?Date of Initial Endocrinology Evaluation: 12/10/2021   ? ? ?HPI: ?Paige [name] is a 31 y.o. female with a past medical history of ***. The patient presented for initial endocrinology clinic visit on 12/10/2021 for consultative assistance with her PCOS.  ? ?*** ? ? ?Patient has been noted to have an elevated androstenedione  at 426 NG/DL during physical evaluation in January 2023, she was also noted with elevated testosterone at 79 NG/DL.  She was also noted to have hirsutism at the time ?Of note patient had normal TSH ? ?HISTORY:  ?Past Medical History:  ?Past Medical History:  ?Diagnosis Date  ? Miscarriage   ? ?Past Surgical History:  ?Past Surgical History:  ?Procedure Laterality Date  ? DILATION AND CURETTAGE OF UTERUS    ? NO PAST SURGERIES    ?  ?Social History:  reports that she has never smoked. She has never used smokeless tobacco. She reports that she does not currently use alcohol. She reports current drug use. Drug: Marijuana. ?Family History: family history includes Colon cancer in her maternal grandmother. ? ? ?HOME MEDICATIONS: ?Allergies as of 12/10/2021   ? ?   Reactions  ? Cherry Hives  ? Hives and swelling  ? Latex   ? ?  ? ?  ?Medication List  ?  ?as of December 10, 2021 10:34 AM ?  ?You have not been prescribed any medications. ?  ?  ? ? ?REVIEW OF SYSTEMS: ?A comprehensive ROS was conducted with the patient and is negative except as per HPI and below:  ?ROS  ? ? ? ?OBJECTIVE:  ?VS: There were no vitals taken for this visit.  ? ?Wt Readings from Last 3 Encounters:  ?09/17/21 147 lb (66.7 kg)  ?12/09/20 146 lb 12.8 oz (66.6 kg)  ?03/11/20 156 lb (70.8 kg)  ? ? ? ?EXAM: ?General: Pt appears well and is in NAD  ?Hydration: Well-hydrated with moist mucous membranes and good skin turgor  ?Eyes: External eye exam  normal without stare, lid lag or exophthalmos.  EOM intact.  PERRL.  ?Ears, Nose, Throat: Hearing: Grossly intact bilaterally ?Dental: Good dentition  ?Throat: Clear without mass, erythema or exudate  ?Neck: General: Supple without adenopathy. ?Thyroid: Thyroid size normal.  No goiter or nodules appreciated. No thyroid bruit.  ?Lungs: Clear with good BS bilat with no rales, rhonchi, or wheezes  ?Heart: Auscultation: RRR.  ?Abdomen: Normoactive bowel sounds, soft, nontender, without masses or organomegaly palpable  ?Extremities: Gait and station: Normal gait  ?Digits and nails: No clubbing, cyanosis, petechiae, or nodes ?Head and neck: Normal alignment and mobility ?BL UE: Normal ROM and strength. ?BL LE: No pretibial edema normal ROM and strength.  ?Skin: Hair: Texture and amount normal with gender appropriate distribution ?Skin Inspection: No rashes, acanthosis nigricans/skin tags. No lipohypertrophy ?Skin Palpation: Skin temperature, texture, and thickness normal to palpation  ?Neuro: Cranial nerves: II - XII grossly intact  ?Cerebellar: Normal coordination and movement; no tremor ?Motor: Normal strength throughout ?DTRs: 2+ and symmetric in UE without delay in relaxation phase  ?Mental Status: Judgment, insight: Intact ?Orientation: Oriented to time, place, and person ?Memory: Intact for recent and remote events ?Mood and affect: No depression, anxiety, or agitation  ? ? ? ?DATA REVIEWED: ?*** ?  ? ?ASSESSMENT/PLAN/RECOMMENDATIONS:  ? ?*** ? ? ? ?  Medications : ? ?Signed electronically by: ?Abby Raelyn Mora, MD ? ?[facility]  ?[facility] ?[address] ?[place], [place] [address] ?Phone: [phone] ?FAX: [phone] ? ? ?CC: ?[facility], [facility] ?[address] [place] [address] ?Phone: [phone] ?Fax: [phone] ? ? ?Return to Endocrinology clinic as below: ?Future Appointments  ?Date Time Provider Department Center  ?12/10/2021  1:00 PM  Brinkley Peet, Konrad Dolores, MD [facility] None  ?  ? ? ? ? ? ?

## 2022-01-17 ENCOUNTER — Ambulatory Visit
Admission: RE | Admit: 2022-01-17 | Discharge: 2022-01-17 | Disposition: A | Discharge status: Home / Self Care | Payer: Medicaid Other | Source: Ambulatory Visit | Attending: Internal Medicine | Admitting: Internal Medicine

## 2022-01-17 DIAGNOSIS — S29019A Strain of muscle and tendon of unspecified wall of thorax, initial encounter: Secondary | ICD-10-CM | POA: Diagnosis not present

## 2022-01-17 DIAGNOSIS — S39012A Strain of muscle, fascia and tendon of lower back, initial encounter: Secondary | ICD-10-CM

## 2022-01-17 MED ORDER — METAXALONE 800 MG PO TABS: 800.0000 mg | ORAL_TABLET | Freq: Three times a day (TID) | ORAL | 0 refills | Status: DC

## 2022-01-17 MED ORDER — MELOXICAM 7.5 MG PO TABS: 7.5000 mg | ORAL_TABLET | Freq: Every day | ORAL | 0 refills | Status: DC
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
## 2022-01-17 NOTE — Discharge Instructions (Signed)
Ice areas of pain 3-4 times a day for 2 days, then alternate with heat You may follow up with a chiropractor and your PCP

## 2022-01-25 ENCOUNTER — Ambulatory Visit: Payer: Self-pay

## 2022-02-18 ENCOUNTER — Ambulatory Visit: Admission: RE | Admit: 2022-02-18 | Payer: Medicaid Other | Source: Ambulatory Visit

## 2022-02-21 ENCOUNTER — Ambulatory Visit
Admission: RE | Admit: 2022-02-21 | Discharge: 2022-02-21 | Disposition: A | Discharge status: Home / Self Care | Payer: Medicaid Other | Source: Ambulatory Visit | Attending: Licensed Practical Nurse | Admitting: Licensed Practical Nurse

## 2022-02-21 DIAGNOSIS — N838 Other noninflammatory disorders of ovary, fallopian tube and broad ligament: Secondary | ICD-10-CM | POA: Diagnosis present

## 2022-02-22 ENCOUNTER — Encounter: Payer: Self-pay | Admitting: Licensed Practical Nurse

## 2022-03-03 ENCOUNTER — Ambulatory Visit: Payer: Medicaid Other | Admitting: Internal Medicine

## 2022-03-11 ENCOUNTER — Ambulatory Visit: Payer: Medicaid Other | Admitting: Internal Medicine

## 2022-03-11 ENCOUNTER — Encounter: Payer: Self-pay | Admitting: Internal Medicine

## 2022-03-11 VITALS — BP 120/80 | HR 72 | Ht 65.0 in | Wt 152.0 lb

## 2022-03-11 DIAGNOSIS — E281 Androgen excess: Secondary | ICD-10-CM | POA: Insufficient documentation

## 2022-03-11 DIAGNOSIS — R7989 Other specified abnormal findings of blood chemistry: Secondary | ICD-10-CM | POA: Diagnosis not present

## 2022-03-11 NOTE — Patient Instructions (Signed)

## 2022-03-11 NOTE — Progress Notes (Unsigned)
Name: Paige [name]  MRN/ DOB: [id number], Aug 08, 1991    Age/ Sex: 31 y.o., female    PCP: Center, [facility]   Reason for Endocrinology Evaluation: PCOS     Date of Initial Endocrinology Evaluation: 03/11/2022     HPI: [name] is a 31 y.o. female [place]  with a past medical history of Hx of nephrolithiasis . The patient presented for initial endocrinology clinic visit on 03/11/2022 for consultative assistance with her PCOS.    During a wellness check through Gyn she was noted with hirsutism which triggered a hormonal test, and has been noted elevated testosterone at 79 NG/DL and DHEAS at 638 UG/DL but normal 75-IE progesterone at 207  She has noted hirsutism for the past 3.5  years. Excessive growth over face, breast, abdomen and back  She has chronic deep and raspy voice  She has a 89 yr old son, had abortion in 2015 but has not been able to get pregnant ever since She denies being in contact with any female partner that uses topical testosterone   Menarche 39, was on Depo from age 65 to age 74  LMP-6/20,2023  gets them regularly and every month Denies galactorrhea  Has rare acne , just pre-menstrual and just around the lip area   She bruises easily  NO LE edema  NO hypokalemia  She has chronic depression  Weight has been increasing  Has severe anxiety Denies palpitations  Has heat intolerance       HISTORY:  Past Medical History:  Past Medical History:  Diagnosis Date   Miscarriage    Past Surgical History:  Past Surgical History:  Procedure Laterality Date   DILATION AND CURETTAGE OF UTERUS     NO PAST SURGERIES      Social History:  reports that she has never smoked. She has never used smokeless tobacco. She reports that she does not currently use alcohol. She reports current drug use. Drug: Marijuana. Family History: family history includes Colon cancer in her maternal grandmother.   HOME MEDICATIONS: Allergies as of  03/11/2022       Reactions   Cherry Hives   Hives and swelling   Latex         Medication List        Accurate as of March 11, 2022  4:38 PM. If you have any questions, ask your nurse or doctor.          meloxicam 7.5 MG tablet Commonly known as: Mobic Take 1 tablet (7.5 mg total) by mouth daily.   metaxalone 800 MG tablet Commonly known as: SKELAXIN Take 1 tablet (800 mg total) by mouth 3 (three) times daily.          REVIEW OF SYSTEMS: A comprehensive ROS was conducted with the patient and is negative except as per HPI     OBJECTIVE:  VS: BP 120/80 (BP Location: Left Arm, Patient Position: Sitting, Cuff Size: Small)   Pulse 72   Ht 5\' 5"  (1.651 m)   Wt 152 lb (68.9 kg)   SpO2 96%   BMI 25.29 kg/m    Wt Readings from Last 3 Encounters:  03/11/22 152 lb (68.9 kg)  09/17/21 147 lb (66.7 kg)  12/09/20 146 lb 12.8 oz (66.6 kg)     EXAM: General: Pt appears well and is in NAD  Neck: General: Supple without adenopathy. Thyroid: Thyroid size normal.  No goiter or nodules appreciated. No thyroid bruit.  Lungs: Clear  with good BS bilat with no rales, rhonchi, or wheezes  Heart: Auscultation: RRR.  Abdomen: Normoactive bowel sounds, soft, nontender, without masses or organomegaly palpable  Extremities:  BL LE: No pretibial edema normal ROM and strength.  Skin: Hair: Excessive hirsutism noted perioral, chin, upper neck, chest, around the nipples, abdomen, and thighs (Ferriman--Gallwey score 25) Skin Inspection: No rashes, acanthosis nigricans/skin tags. Skin Palpation: Skin temperature, texture, and thickness normal to palpation  Mental Status: Judgment, insight: Intact Orientation: Oriented to time, place, and person Mood and affect: No depression, anxiety, or agitation     DATA REVIEWED: ***    Latest Reference Range & Units 09/20/21 09:46 09/24/21 09:15  17 HYDROXYPROGESTERONE ng/dL 323   DHEA-SO4 55.7 - 322.0 ug/dL 254.2 (H)   LH mIU/mL 70.6    FSH mIU/mL 6.8   Prolactin 4.8 - 23.3 ng/mL 11.8   Hemoglobin A1C 4.8 - 5.6 %  5.1  Est. average glucose Bld gHb Est-mCnc mg/dL  237  ANDROSTENEDIONE 41 - 262 ng/dL 628 (H)   Testosterone 8 - 60 ng/dL 79 (H)   Testosterone Free 0.0 - 4.2 pg/mL 5.9 (H)     Pelvic ultrasound 02/21/2022 Measurements: 7.9 x 4.8 x 5.1 cm = volume: 99 mL. Anteverted. Normal morphology without mass   Endometrium   Thickness: 4 mm.  No endometrial fluid or mass   Right ovary   Measurements: 2.9 x 3.1 x 3.9 cm = volume: 18 mL. Normal morphology without mass. Resolution of corpus luteum seen on previous study.   Left ovary   Measurements: 3.2 x 2.3 x 2.3 cm = volume: 9 mL. Normal morphology without mass   Other findings   No free pelvic fluid or adnexal masses.   IMPRESSION: Normal exam.   ASSESSMENT/PLAN/RECOMMENDATIONS:   Hirsutism:  -The patient does not meet definition criteria of PCOS due to lack of menstrual irregularity and ovarian cysts.  She does however have hirsutism with evidence of hyperandrogenism -The highest elevations noted in DHEA-S which is of adrenal origin, we will proceed with adrenal imaging -I am also going to retest her testosterone, DHEA-S, and also screening for Cushing with 24-hour urinary cortisol -Management will be dependent on biochemical and imaging findings    Follow-up in 2 months  Signed electronically by: Paige Herrlich, MD  [facility] End[facility]  [facility] [address] [place] [place], [place] [address] Phone: [phone] FAX: [phone]   CC: Center, [facility] [address] [place] [address] Phone: [phone] Fax: [phone]   Return to Endocrinology clinic as below: Future Appointments  Date Time Provider Department Center  03/14/2022  3:35 PM Paige Bossier, MD [facility] None  05/19/2022 12:50 PM Paige [name], Paige Dolores, MD [facility] None

## 2022-03-14 ENCOUNTER — Ambulatory Visit: Payer: Medicaid Other | Admitting: Obstetrics & Gynecology

## 2022-03-14 ENCOUNTER — Telehealth: Payer: Self-pay

## 2022-03-14 ENCOUNTER — Other Ambulatory Visit (HOSPITAL_COMMUNITY)
Admission: RE | Admit: 2022-03-14 | Discharge: 2022-03-14 | Disposition: A | Discharge status: Home / Self Care | Payer: Medicaid Other | Source: Ambulatory Visit | Attending: Obstetrics & Gynecology | Admitting: Obstetrics & Gynecology

## 2022-03-14 ENCOUNTER — Encounter: Payer: Self-pay | Admitting: Obstetrics & Gynecology

## 2022-03-14 VITALS — BP 114/66 | Wt 154.0 lb

## 2022-03-14 DIAGNOSIS — Z30011 Encounter for initial prescription of contraceptive pills: Secondary | ICD-10-CM | POA: Diagnosis not present

## 2022-03-14 DIAGNOSIS — Z113 Encounter for screening for infections with a predominantly sexual mode of transmission: Secondary | ICD-10-CM | POA: Diagnosis present

## 2022-03-14 DIAGNOSIS — E281 Androgen excess: Secondary | ICD-10-CM | POA: Diagnosis not present

## 2022-03-14 DIAGNOSIS — N898 Other specified noninflammatory disorders of vagina: Secondary | ICD-10-CM

## 2022-03-14 MED ORDER — FLUCONAZOLE 150 MG PO TABS: 150.0000 mg | ORAL_TABLET | Freq: Once | ORAL | 0 refills | Status: DC

## 2022-03-14 MED ORDER — FLUCONAZOLE 150 MG PO TABS: 150.0000 mg | ORAL_TABLET | Freq: Once | ORAL | 0 refills | Status: AC

## 2022-03-14 MED ORDER — NORGESTREL-ETHINYL ESTRADIOL 0.3-30 MG-MCG PO TABS: 1.0000 | ORAL_TABLET | Freq: Every day | ORAL | 5 refills | Status: DC

## 2022-03-14 MED ORDER — METRONIDAZOLE 500 MG PO TABS: 500.0000 mg | ORAL_TABLET | Freq: Two times a day (BID) | ORAL | 0 refills | Status: DC

## 2022-03-14 NOTE — Telephone Encounter (Signed)
[facility] left message stating that CT of abdomen pelvis needs to be change to just with contrast. They are unable to do with and without.

## 2022-03-14 NOTE — Progress Notes (Signed)
   Subjective:    Patient ID: Paige [name], female    DOB: [birth year], 31 y.o.   MRN: [id number]  HPI 31 yo single P1 here for STI testing as she discovered texts several weeks ago that led her to discover that her partner had sex with another person starting several months ago.   She has a h/o BV and used flagyl in the past but ended up getting a yeast infection as well.  She is having unprotected sex and would like to start OCPs for contraception. She has been abstinent for 3 weeks and doesn't plan on sex any time soon but would like OCPs.  She is also being evaluated with a CT this Thursday for elevated testosterone. She was finally able to see an endocrinologist last week but her follow up appt with that doc isn't until September. She is very frustrated with the hirsuitism and wants treatment asap.   Review of Systems     Objective:   Physical Exam Well nourished, well hydrated African American female, no apparent distress She is ambulating and conversing normally. Significant facial hirsuitism noted Genitourinary:             External: Normal external female genitalia.  Normal urethral meatus, normal Bartholin's and Skene's glands.               Vagina: Normal vaginal mucosa, no evidence of prolapse.               Cervix: Grossly normal in appearance, no bleeding           Vaginal discharge white with significant frothiness, no odor noted             Rectal: deferred    Assessment & Plan:   1) STI screening  2) vaginal discharge- I will prescribe flagyl which will treat both BV and trich Aptima testing sent  3) contraception- she will start lo ovral on the first day of NMP, rec abstinence for first week She will get her BP checked at her place of employment.  4) hirsuitism with elevated testosterone- I will look at her CT results next week and if there is nothing concerning, then I will prescribe aldosterone until she can see the endocrinologist in September

## 2022-03-15 LAB — RPR: RPR Ser Ql: NONREACTIVE

## 2022-03-15 LAB — HEPATITIS B SURFACE ANTIGEN: Hepatitis B Surface Ag: NEGATIVE

## 2022-03-15 LAB — HIV ANTIBODY (ROUTINE TESTING W REFLEX): HIV Screen 4th Generation wRfx: NONREACTIVE

## 2022-03-15 LAB — HEPATITIS C ANTIBODY: Hep C Virus Ab: NONREACTIVE

## 2022-03-16 LAB — CERVICOVAGINAL ANCILLARY ONLY
Bacterial Vaginitis (gardnerella): POSITIVE — AB
Candida Glabrata: NEGATIVE
Candida Vaginitis: POSITIVE — AB
Chlamydia: NEGATIVE
Comment: NEGATIVE
Comment: NEGATIVE
Comment: NEGATIVE
Comment: NEGATIVE
Comment: NEGATIVE
Comment: NORMAL
Neisseria Gonorrhea: NEGATIVE
Trichomonas: NEGATIVE

## 2022-03-17 ENCOUNTER — Inpatient Hospital Stay: Admission: RE | Admit: 2022-03-17 | Payer: Medicaid Other | Source: Ambulatory Visit

## 2022-03-17 LAB — COMPREHENSIVE METABOLIC PANEL WITH GFR
AG Ratio: 2.1 (calc) (ref 1.0–2.5)
ALT: 34 U/L — ABNORMAL HIGH (ref 6–29)
AST: 18 U/L (ref 10–30)
Albumin: 4.6 g/dL (ref 3.6–5.1)
Alkaline phosphatase (APISO): 69 U/L (ref 31–125)
BUN: 13 mg/dL (ref 7–25)
CO2: 26 mmol/L (ref 20–32)
Calcium: 9.6 mg/dL (ref 8.6–10.2)
Chloride: 107 mmol/L (ref 98–110)
Creat: 0.83 mg/dL (ref 0.50–0.97)
Globulin: 2.2 g/dL (ref 1.9–3.7)
Glucose, Bld: 83 mg/dL (ref 65–99)
Potassium: 4.2 mmol/L (ref 3.5–5.3)
Sodium: 139 mmol/L (ref 135–146)
Total Bilirubin: 0.3 mg/dL (ref 0.2–1.2)
Total Protein: 6.8 g/dL (ref 6.1–8.1)

## 2022-03-17 LAB — TESTOSTERONE, TOTAL, LC/MS/MS: Testosterone, Total, LC-MS-MS: 81 ng/dL — ABNORMAL HIGH (ref 2–45)

## 2022-03-17 LAB — DHEA-SULFATE: DHEA-SO4: 420 ug/dL — ABNORMAL HIGH (ref 19–237)

## 2022-03-17 LAB — PROLACTIN: Prolactin: 3.5 ng/mL

## 2022-03-17 LAB — TSH: TSH: 1.03 m[IU]/L

## 2022-03-17 LAB — ANDROSTENEDIONE: Androstenedione: 429 ng/dL — ABNORMAL HIGH

## 2022-03-17 LAB — 17-HYDROXYPROGESTERONE: 17-OH-Progesterone, LC/MS/MS: 142 ng/dL

## 2022-03-19 ENCOUNTER — Other Ambulatory Visit: Payer: Self-pay | Admitting: Obstetrics & Gynecology

## 2022-03-19 ENCOUNTER — Encounter: Payer: Self-pay | Admitting: Obstetrics & Gynecology

## 2022-03-19 MED ORDER — FLUCONAZOLE 150 MG PO TABS: 150.0000 mg | ORAL_TABLET | Freq: Once | ORAL | 0 refills | Status: AC

## 2022-03-19 NOTE — Progress Notes (Signed)
Diflucan prescribed

## 2022-03-22 ENCOUNTER — Ambulatory Visit
Admission: RE | Admit: 2022-03-22 | Discharge: 2022-03-22 | Disposition: A | Discharge status: Home / Self Care | Payer: Medicaid Other | Source: Ambulatory Visit | Attending: Internal Medicine | Admitting: Internal Medicine

## 2022-03-22 DIAGNOSIS — E281 Androgen excess: Secondary | ICD-10-CM

## 2022-03-22 MED ORDER — IOPAMIDOL (ISOVUE-300) INJECTION 61%
100.0000 mL | Freq: Once | INTRAVENOUS | Status: AC | PRN
Start: 2022-03-22 — End: 2022-03-22
  Administered 2022-03-22: 100 mL via INTRAVENOUS

## 2022-03-24 ENCOUNTER — Telehealth: Payer: Self-pay | Admitting: Internal Medicine

## 2022-03-24 MED ORDER — SPIRONOLACTONE 50 MG PO TABS: 50.0000 mg | ORAL_TABLET | Freq: Every day | ORAL | 1 refills | Status: DC

## 2022-03-24 NOTE — Telephone Encounter (Signed)
I was able to speak to Ms. Sproull on 03/24/2022 at 1415   We discussed normal adrenal imaging and there is no evidence of adrenal tumors  I have recommended proceeding with medical treatment   She was recently prescribed COC's through her gynecologist, which I did explain to the patient that estrogen is the first-line of treatment to reduce androgenic hormones  I have also recommended starting her on spironolactone, I have emphasized the importance of taking COC's with spironolactone to prevent pregnancy.  I have also explained to the patient that spironolactone has teratogenic effect and it is contraindicated to try to get pregnant while on spironolactone or to take spironolactone without any COC's   Patient expressed understanding  A prescription spironolactone 50 mg daily has been sent to the pharmacy

## 2022-04-20 ENCOUNTER — Telehealth: Payer: Self-pay

## 2022-04-20 NOTE — Telephone Encounter (Signed)
Pt called back, she said pill went down sink and she did not grab next pill on her pack so she technically missed one day. I advised for future purposes, just take the next pill on her pack so she doesn't misses one day (she would finish her pack one day earlier). Also she might start her period early.

## 2022-04-20 NOTE — Telephone Encounter (Signed)
Pt left msg on triage saying she dropped a pill in sink and what would be protocol? Unsure of her qs, called her back, no answer, LVMTRC.

## 2022-04-29 ENCOUNTER — Ambulatory Visit: Payer: Medicaid Other

## 2022-04-29 ENCOUNTER — Other Ambulatory Visit (HOSPITAL_COMMUNITY)
Admission: RE | Admit: 2022-04-29 | Discharge: 2022-04-29 | Disposition: A | Discharge status: Home / Self Care | Payer: Medicaid Other | Source: Ambulatory Visit | Attending: Obstetrics & Gynecology | Admitting: Obstetrics & Gynecology

## 2022-04-29 ENCOUNTER — Telehealth: Payer: Self-pay

## 2022-04-29 VITALS — BP 102/64 | Ht 63.0 in | Wt 151.0 lb

## 2022-04-29 DIAGNOSIS — Z202 Contact with and (suspected) exposure to infections with a predominantly sexual mode of transmission: Secondary | ICD-10-CM

## 2022-04-29 DIAGNOSIS — Z113 Encounter for screening for infections with a predominantly sexual mode of transmission: Secondary | ICD-10-CM

## 2022-04-29 NOTE — Telephone Encounter (Signed)
Lab orders placed, can schedule appt nex wk

## 2022-04-29 NOTE — Progress Notes (Signed)
Patient was here for self swab nurse visit for routine STD testing. Would like to be tested for gonorrhea/chlamydia/trich. Denies unusual vaginal discharge, itching, or odor, bleeding, pelvic pain.

## 2022-04-29 NOTE — Telephone Encounter (Signed)
Pt came in for self swab nurse visit for std testing. She would like to get a full blood panel for std testing too. Can you put orders in so she can schedule a lab visit?

## 2022-05-03 NOTE — Telephone Encounter (Signed)
Pt aware and transferred to [facility] to schedule lab appt.

## 2022-05-04 ENCOUNTER — Other Ambulatory Visit: Payer: Medicaid Other

## 2022-05-04 DIAGNOSIS — Z113 Encounter for screening for infections with a predominantly sexual mode of transmission: Secondary | ICD-10-CM

## 2022-05-04 LAB — CERVICOVAGINAL ANCILLARY ONLY
Chlamydia: NEGATIVE
Comment: NEGATIVE
Comment: NEGATIVE
Comment: NORMAL
Neisseria Gonorrhea: NEGATIVE
Trichomonas: NEGATIVE

## 2022-05-05 LAB — HEPATITIS C ANTIBODY: Hep C Virus Ab: NONREACTIVE

## 2022-05-05 LAB — HIV ANTIBODY (ROUTINE TESTING W REFLEX): HIV Screen 4th Generation wRfx: NONREACTIVE

## 2022-05-05 LAB — RPR: RPR Ser Ql: NONREACTIVE

## 2022-05-18 NOTE — Progress Notes (Deleted)
Name: Dari Carpenito  MRN/ DOB: [id number], 04-May-1991    Age/ Sex: 31 y.o., female    PCP: [facility], [facility]   Reason for Endocrinology Evaluation: PCOS     Date of Initial Endocrinology Evaluation: 03/11/2022    HPI: [name] is a 31 y.o. female , [place] with a past medical history of nephrolithiasis . The patient presented for initial endocrinology clinic visit on7/17/2023  for consultative assistance with her PCOS.    During a wellness check through Gyn she was noted with hirsutism which triggered a hormonal test, and has been noted elevated testosterone at 79 NG/DL and DHEAS at 387 UG/DL but normal 56-EP progesterone at 207  She has noted hirsutism for the past 3.5  years prior to her presentation  . Excessive growth over face, breast, abdomen and back  She has a 69 yr old son, had abortion in 2015 but has not been able to get pregnant ever since She denies being in contact with any female partner that uses topical testosterone    Menarche 77, was on Depo from age 74 to age 31  Has history of regular menstruations    Repeat labs at our office continues to show elevated DHEA-S at 420 mcg/dL (down from 329), normal prolactin, elevated androstenedione 429 (reference 51-213), testosterone 81NG/DL, and normal 17 hydroxyprogesterone and TFTs   Adrenal imaging came back negative on 03/22/2022, patient was already on COC's through her gynecologist and started her on spironolactone SUBJECTIVE:    Today (@TD @): Ms. Needles is here for follow-up on hyperandrogenism   Spironolactone 50 mg daily Norgestrel-ethinyl estradiol 0.3-30  HISTORY:  Past Medical History:  Past Medical History:  Diagnosis Date   Miscarriage    Past Surgical History:  Past Surgical History:  Procedure Laterality Date   DILATION AND CURETTAGE OF UTERUS     NO PAST SURGERIES      Social History:  reports that she has never smoked. She has never used smokeless  tobacco. She reports that she does not currently use alcohol. She reports current drug use. Drug: Marijuana. Family History: family history includes Colon cancer in her maternal grandmother.   HOME MEDICATIONS: Allergies as of 05/19/2022       Reactions   Cherry Hives   Hives and swelling   Latex         Medication List        Accurate as of May 18, 2022  2:53 PM. If you have any questions, ask your nurse or doctor.          meloxicam 7.5 MG tablet Commonly known as: Mobic Take 1 tablet (7.5 mg total) by mouth daily.   metaxalone 800 MG tablet Commonly known as: SKELAXIN Take 1 tablet (800 mg total) by mouth 3 (three) times daily.   metroNIDAZOLE 500 MG tablet Commonly known as: FLAGYL Take 1 tablet (500 mg total) by mouth 2 (two) times daily.   norgestrel-ethinyl estradiol 0.3-30 MG-MCG tablet Commonly known as: LO/OVRAL Take 1 tablet by mouth daily.   spironolactone 50 MG tablet Commonly known as: Aldactone Take 1 tablet (50 mg total) by mouth daily.          REVIEW OF SYSTEMS: A comprehensive ROS was conducted with the patient and is negative except as per HPI     OBJECTIVE:  VS: LMP 04/22/2022 (Exact Date)    Wt Readings from Last 3 Encounters:  04/29/22 151 lb (68.5 kg)  03/14/22 154 lb (69.9 kg)  03/11/22 152 lb (68.9 kg)     EXAM: General: Pt appears well and is in NAD  Neck: General: Supple without adenopathy. Thyroid: Thyroid size normal.  No goiter or nodules appreciated. No thyroid bruit.  Lungs: Clear with good BS bilat with no rales, rhonchi, or wheezes  Heart: Auscultation: RRR.  Abdomen: Normoactive bowel sounds, soft, nontender, without masses or organomegaly palpable  Extremities:  BL LE: No pretibial edema normal ROM and strength.  Skin: Hair: Excessive hirsutism noted perioral, chin, upper neck, chest, around the nipples, abdomen, and thighs (Ferriman--Gallwey score 25) Skin Inspection: No rashes, acanthosis  nigricans/skin tags. Skin Palpation: Skin temperature, texture, and thickness normal to palpation  Mental Status: Judgment, insight: Intact Orientation: Oriented to time, place, and person Mood and affect: No depression, anxiety, or agitation     DATA REVIEWED:     Latest Reference Range & Units 03/11/22 15:44  Sodium 135 - 146 mmol/L 139  Potassium 3.5 - 5.3 mmol/L 4.2  Chloride 98 - 110 mmol/L 107  CO2 20 - 32 mmol/L 26  Glucose 65 - 99 mg/dL 83  BUN 7 - 25 mg/dL 13  Creatinine 0.50 - 0.97 mg/dL 0.83  Calcium 8.6 - 10.2 mg/dL 9.6  BUN/Creatinine Ratio 6 - 22 (calc) NOT APPLICABLE  AG Ratio 1.0 - 2.5 (calc) 2.1  AST 10 - 30 U/L 18  ALT 6 - 29 U/L 34 (H)  Total Protein 6.1 - 8.1 g/dL 6.8  Total Bilirubin 0.2 - 1.2 mg/dL 0.3  Alkaline phosphatase (APISO) 31 - 125 U/L 69  Globulin 1.9 - 3.7 g/dL (calc) 2.2  DHEA-SO4 19 - 237 mcg/dL 420 (H)  Prolactin ng/mL 3.5  TSH mIU/L 1.03  Albumin MSPROF 3.6 - 5.1 g/dL 4.6    Latest Reference Range & Units 09/20/21 09:46 09/24/21 09:15  17 HYDROXYPROGESTERONE ng/dL 207   DHEA-SO4 84.8 - 378.0 ug/dL 586.0 (H)   LH mIU/mL 37.2   FSH mIU/mL 6.8   Prolactin 4.8 - 23.3 ng/mL 11.8   Hemoglobin A1C 4.8 - 5.6 %  5.1  Est. average glucose Bld gHb Est-mCnc mg/dL  100  ANDROSTENEDIONE 41 - 262 ng/dL 426 (H)   Testosterone 8 - 60 ng/dL 79 (H)   Testosterone Free 0.0 - 4.2 pg/mL 5.9 (H)     Pelvic ultrasound 02/21/2022 Measurements: 7.9 x 4.8 x 5.1 cm = volume: 99 mL. Anteverted. Normal morphology without mass   Endometrium   Thickness: 4 mm.  No endometrial fluid or mass   Right ovary   Measurements: 2.9 x 3.1 x 3.9 cm = volume: 18 mL. Normal morphology without mass. Resolution of corpus luteum seen on previous study.   Left ovary   Measurements: 3.2 x 2.3 x 2.3 cm = volume: 9 mL. Normal morphology without mass   Other findings   No free pelvic fluid or adnexal masses.   IMPRESSION: Normal exam.   CT abdomen  03/22/2022 Adrenals/Urinary Tract: Normal adrenal glands. No masses identified. Medullary nephrocalcinosis and several tiny less than 5 mm renal calculi are seen bilaterally. No evidence of ureteral calculi or hydronephrosis.   Stomach/Bowel: No evidence of obstruction, inflammatory process or abnormal fluid collections. Normal appendix visualized.   Vascular/Lymphatic: No pathologically enlarged lymph nodes. No acute vascular findings.   Reproductive: Normal appearance of uterus. No ovarian or adnexal masses identified. No evidence of free fluid.    ASSESSMENT/PLAN/RECOMMENDATIONS:   Hirsutism:  -The patient does not meet definition criteria of PCOS due to lack of menstrual irregularity and  ovarian cysts.  She does however have hirsutism with evidence of hyperandrogenism -The highest elevations noted in DHEA-S which is of adrenal origin, we will proceed with adrenal imaging -I am also going to retest her testosterone, DHEA-S, and also screening for Cushing with 24-hour urinary cortisol -Management will be dependent on biochemical and imaging findings    Follow-up in 2 months  Signed electronically by: Lyndle Herrlich, MD  [facility] Endocrinology  [facility] [address] [place] [place], [place] [address] Phone: [phone] FAX: [phone]   CC: Center, [place] [address] [place] [address] Phone: [phone] Fax: [phone]   Return to Endocrinology clinic as below: Future Appointments  Date Time Provider Department Center  05/19/2022 12:50 PM Signora Zucco, Konrad Dolores, MD [facility] None

## 2022-05-19 ENCOUNTER — Ambulatory Visit: Payer: Medicaid Other | Admitting: Internal Medicine

## 2022-06-02 ENCOUNTER — Ambulatory Visit: Payer: Medicaid Other

## 2022-07-08 ENCOUNTER — Other Ambulatory Visit (HOSPITAL_COMMUNITY)
Admission: RE | Admit: 2022-07-08 | Discharge: 2022-07-08 | Disposition: A | Discharge status: Home / Self Care | Payer: Medicaid Other | Source: Ambulatory Visit | Attending: Obstetrics and Gynecology | Admitting: Obstetrics and Gynecology

## 2022-07-08 ENCOUNTER — Ambulatory Visit (INDEPENDENT_AMBULATORY_CARE_PROVIDER_SITE_OTHER): Payer: Medicaid Other

## 2022-07-08 VITALS — BP 119/78 | HR 67 | Ht 63.0 in | Wt 153.0 lb

## 2022-07-08 DIAGNOSIS — Z202 Contact with and (suspected) exposure to infections with a predominantly sexual mode of transmission: Secondary | ICD-10-CM

## 2022-07-08 NOTE — Progress Notes (Signed)
Patient presents today for STD testing via self swab culture. She states possible STD exposure. No Symptoms at this time. Self swab culture preformed. All questions asked, patient aware office will be in touch with results.Incldued blood work due to possible exposure.

## 2022-07-09 LAB — RPR: RPR Ser Ql: NONREACTIVE

## 2022-07-09 LAB — HIV ANTIBODY (ROUTINE TESTING W REFLEX): HIV Screen 4th Generation wRfx: NONREACTIVE

## 2022-07-09 LAB — HEPATITIS C ANTIBODY: Hep C Virus Ab: NONREACTIVE

## 2022-07-12 LAB — CERVICOVAGINAL ANCILLARY ONLY
Bacterial Vaginitis (gardnerella): POSITIVE — AB
Candida Glabrata: NEGATIVE
Candida Vaginitis: NEGATIVE
Chlamydia: NEGATIVE
Comment: NEGATIVE
Comment: NEGATIVE
Comment: NEGATIVE
Comment: NEGATIVE
Comment: NEGATIVE
Comment: NORMAL
Neisseria Gonorrhea: NEGATIVE
Trichomonas: NEGATIVE

## 2022-07-13 ENCOUNTER — Telehealth: Payer: Self-pay

## 2022-07-13 DIAGNOSIS — B9689 Other specified bacterial agents as the cause of diseases classified elsewhere: Secondary | ICD-10-CM

## 2022-07-13 DIAGNOSIS — N760 Acute vaginitis: Secondary | ICD-10-CM

## 2022-07-13 DIAGNOSIS — B379 Candidiasis, unspecified: Secondary | ICD-10-CM

## 2022-07-13 MED ORDER — METRONIDAZOLE 500 MG PO TABS: 500.0000 mg | ORAL_TABLET | Freq: Two times a day (BID) | ORAL | 0 refills | Status: DC

## 2022-07-13 MED ORDER — FLUCONAZOLE 150 MG PO TABS: 150.0000 mg | ORAL_TABLET | Freq: Once | ORAL | 0 refills | Status: AC

## 2022-07-13 NOTE — Telephone Encounter (Signed)
Pt calling triage requesting Rx for her positive BV and if Rx for yeast can also be called in cause she usually gets a yeast after BV. I apologized for the delay in Rx since ordering provider out of office.

## 2022-07-13 NOTE — Progress Notes (Signed)
Pls treat per office protocol since I' not in the office. Thank you.

## 2022-07-14 ENCOUNTER — Other Ambulatory Visit: Payer: Self-pay

## 2022-07-14 DIAGNOSIS — N760 Acute vaginitis: Secondary | ICD-10-CM

## 2022-07-14 MED ORDER — METRONIDAZOLE 500 MG PO TABS: 500.0000 mg | ORAL_TABLET | Freq: Two times a day (BID) | ORAL | 0 refills | Status: DC

## 2022-08-10 ENCOUNTER — Ambulatory Visit (INDEPENDENT_AMBULATORY_CARE_PROVIDER_SITE_OTHER): Payer: Medicaid Other

## 2022-08-10 ENCOUNTER — Other Ambulatory Visit (HOSPITAL_COMMUNITY)
Admission: RE | Admit: 2022-08-10 | Discharge: 2022-08-10 | Disposition: A | Discharge status: Home / Self Care | Payer: Medicaid Other | Source: Ambulatory Visit | Attending: Obstetrics and Gynecology | Admitting: Obstetrics and Gynecology

## 2022-08-10 VITALS — BP 125/73 | HR 69 | Ht 63.0 in | Wt 154.0 lb

## 2022-08-10 DIAGNOSIS — B9689 Other specified bacterial agents as the cause of diseases classified elsewhere: Secondary | ICD-10-CM | POA: Diagnosis present

## 2022-08-10 DIAGNOSIS — B379 Candidiasis, unspecified: Secondary | ICD-10-CM

## 2022-08-10 DIAGNOSIS — N760 Acute vaginitis: Secondary | ICD-10-CM | POA: Diagnosis present

## 2022-08-10 NOTE — Progress Notes (Signed)
Patient would like to get retested for BV/Yeast. She is currently having some vaginal itching and irritation and a "medicine vaginal odor", not sour/fishy. She wants to add STI testing as well. Denies pelvic pain.

## 2022-08-10 NOTE — Patient Instructions (Signed)
Vaginitis  Vaginitis is irritation and swelling of the vagina. Treatment will depend on the cause. What are the causes? It can be caused by: Bacteria. Yeast. A parasite. A virus. Low hormone levels. Bubble baths, scented tampons, and feminine sprays. Other things can change the balance of the yeast and bacteria that live in the vagina. These include: Antibiotic medicines. Not being clean enough. Some birth control methods. Sex. Infection. Diabetes. A weakened body defense system (immune system). What increases the risk? Smoking or being around someone who smokes. Using washes (douches), scented tampons, or scented pads. Wearing tight pants or thong underwear. Using birth control pills or an IUD. Having sex without a condom or having a lot of partners. Having an STI. Using a certain product to kill sperm (nonoxynol-9). Eating foods that are high in sugar. Having diabetes. Having low levels of a female hormone. Having a weakened body defense system. Being pregnant or breastfeeding. What are the signs or symptoms? Fluid coming from the vagina that is not normal. A bad smell. Itching, pain, or swelling. Pain with sex. Pain or burning when you pee (urinate). Sometimes there are no symptoms. How is this treated? Treatment may include: Antibiotic creams or pills. Antifungal medicines. Medicines to ease symptoms if you have a virus. Your sex partner should also be treated. Estrogen medicines. Avoiding scented soaps, sprays, or douches. Stopping use of products that caused irritation and then using a cream to treat symptoms. Follow these instructions at home: Lifestyle Keep the area around your vagina clean and dry. Avoid using soap. Rinse the area with water. Until your doctor says it is okay: Do not use washes for the vagina. Do not use tampons. Do not have sex. Wipe from front to back after going to the bathroom. When your doctor says it is okay, practice safe sex  and use condoms. General instructions Take over-the-counter and prescription medicines only as told by your doctor. If you were prescribed an antibiotic medicine, take or use it as told by your doctor. Do not stop taking or using it even if you start to feel better. Keep all follow-up visits. How is this prevented? Do not use things that can irritate the vagina, such as fabric softeners. Avoid these products if they are scented: Sprays. Detergents. Tampons. Products for cleaning the vagina. Soaps or bubble baths. Let air reach your vagina. To do this: Wear cotton underwear. Do not wear: Underwear while you sleep. Tight pants. Thong underwear. Underwear or nylons without a cotton panel. Take off any wet clothing, such as bathing suits, as soon as you can. Practice safe sex and use condoms. Contact a doctor if: You have pain in your belly or in the area between your hips. You have a fever or chills. Your symptoms last for more than 2-3 days. Get help right away if: You have a fever and your symptoms get worse all of a sudden. Summary Vaginitis is irritation and swelling of the vagina. Treatment will depend on the cause of the condition. Do not use washes or tampons or have sex until your doctor says it is okay. This information is not intended to replace advice given to you by your health care provider. Make sure you discuss any questions you have with your health care provider. Document Revised: 02/13/2020 Document Reviewed: 02/13/2020 Elsevier Patient Education  2023 [employer]  

## 2022-08-11 ENCOUNTER — Other Ambulatory Visit: Payer: Self-pay | Admitting: Obstetrics and Gynecology

## 2022-08-11 ENCOUNTER — Other Ambulatory Visit: Payer: Self-pay

## 2022-08-11 DIAGNOSIS — B9689 Other specified bacterial agents as the cause of diseases classified elsewhere: Secondary | ICD-10-CM

## 2022-08-11 DIAGNOSIS — B379 Candidiasis, unspecified: Secondary | ICD-10-CM

## 2022-08-11 LAB — CERVICOVAGINAL ANCILLARY ONLY
Bacterial Vaginitis (gardnerella): POSITIVE — AB
Candida Glabrata: NEGATIVE
Candida Vaginitis: NEGATIVE
Chlamydia: NEGATIVE
Comment: NEGATIVE
Comment: NEGATIVE
Comment: NEGATIVE
Comment: NEGATIVE
Comment: NEGATIVE
Comment: NORMAL
Neisseria Gonorrhea: NEGATIVE
Trichomonas: NEGATIVE

## 2022-08-11 MED ORDER — CLINDAMYCIN HCL 300 MG PO CAPS: 300.0000 mg | ORAL_CAPSULE | Freq: Two times a day (BID) | ORAL | 0 refills | Status: AC

## 2022-08-11 MED ORDER — FLUCONAZOLE 150 MG PO TABS: 150.0000 mg | ORAL_TABLET | Freq: Once | ORAL | 0 refills | Status: AC

## 2022-08-11 MED ORDER — METRONIDAZOLE 500 MG PO TABS: 500.0000 mg | ORAL_TABLET | Freq: Two times a day (BID) | ORAL | 0 refills | Status: DC

## 2022-08-11 NOTE — Progress Notes (Signed)
Spoke with pt. Told her to treat with clindamycin and not metronidazole (as already eRxd by [facility]). I called [facility] and canceled flagyl Rx. Pt understands.

## 2022-08-11 NOTE — Telephone Encounter (Signed)
Pt results was positive for BV pt aware medications sent in and diflucan sent in because she gets yeast after antibiotics

## 2022-08-11 NOTE — Addendum Note (Signed)
Addended by: Althea Grimmer B on: 08/11/2022 05:09 PM   Modules accepted: Orders

## 2022-10-07 ENCOUNTER — Telehealth: Payer: Self-pay

## 2022-10-20 IMAGING — US US PELVIS COMPLETE WITH TRANSVAGINAL
1 series · 15 of 25 positions shown · non-contrast
Comparison: None

CLINICAL DATA: PCOS

EXAM:
TRANSABDOMINAL AND TRANSVAGINAL ULTRASOUND OF PELVIS
TECHNIQUE: Both transabdominal and transvaginal ultrasound examinations of the
pelvis were performed. Transabdominal technique was performed for
global imaging of the pelvis including uterus, ovaries, adnexal
regions, and pelvic cul-de-sac. It was necessary to proceed with
endovaginal exam following the transabdominal exam to visualize the
endometrium and ovaries.

[Series 1: us pelvis complete · 15 of 186 slices shown]
[im 1/186]
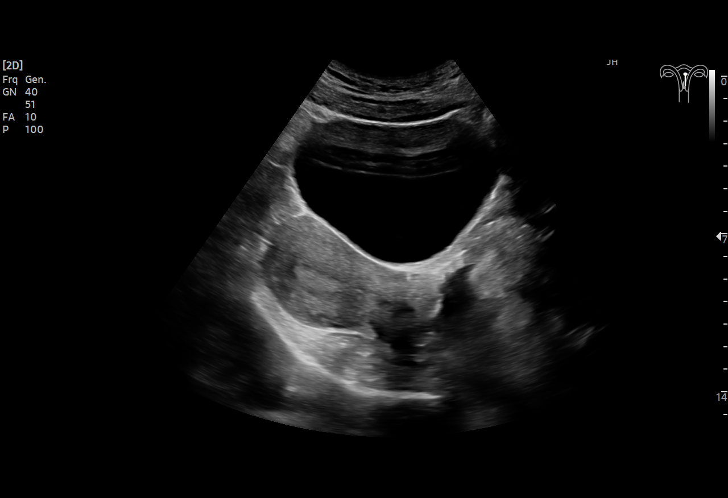
[im 16/186]
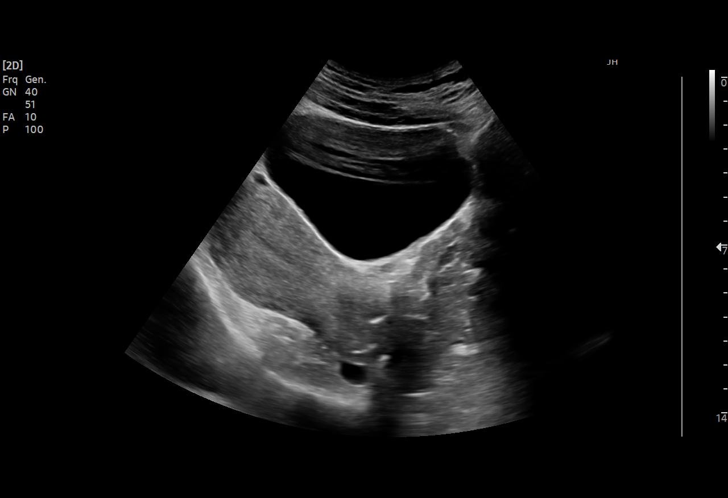
[im 31/186]
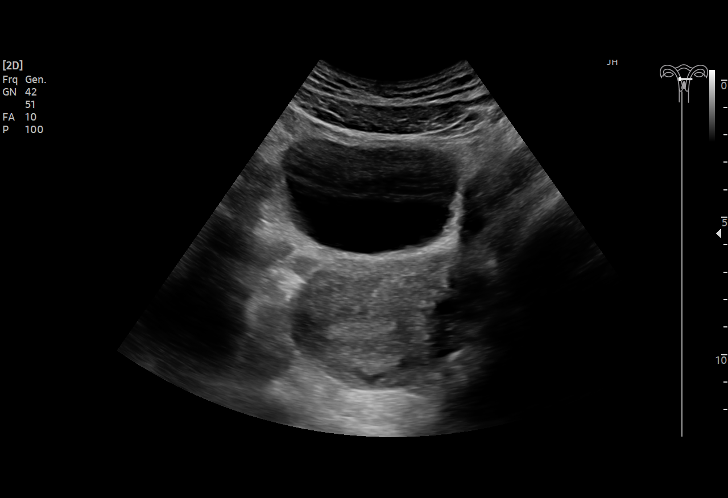
[im 39/186]
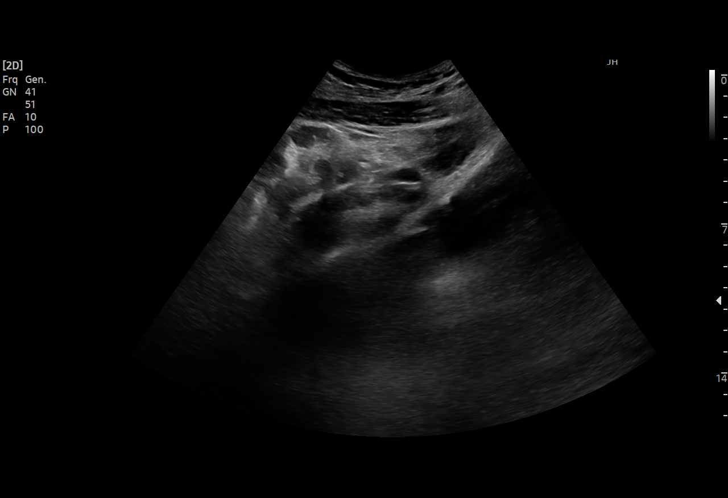
[im 54/186]
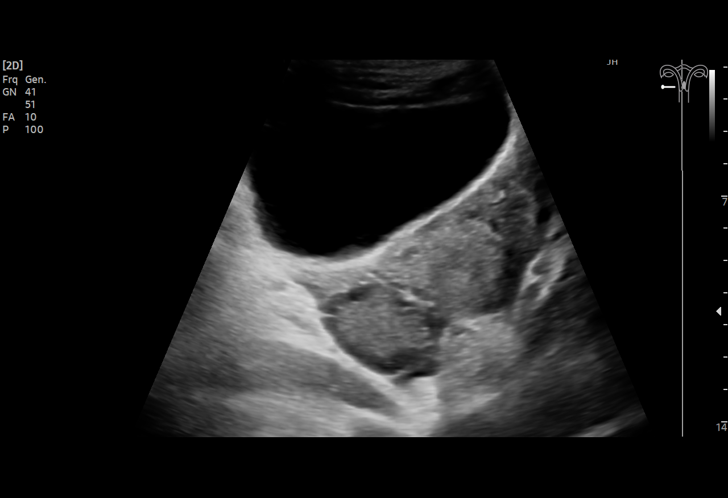
[im 70/186]
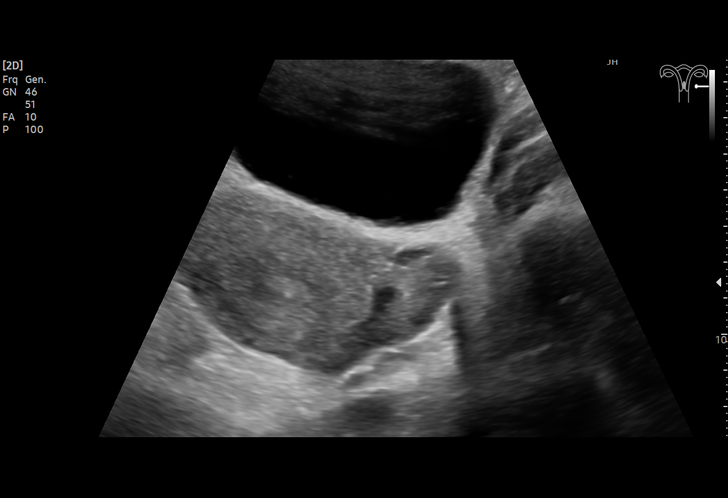
[im 78/186]
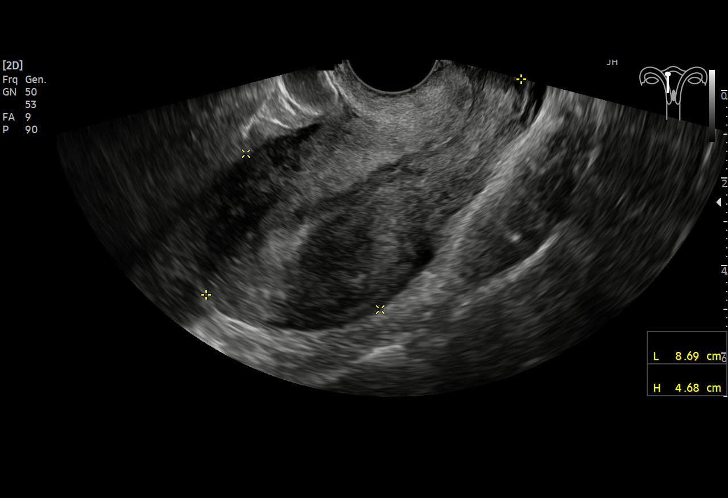
[im 93/186]
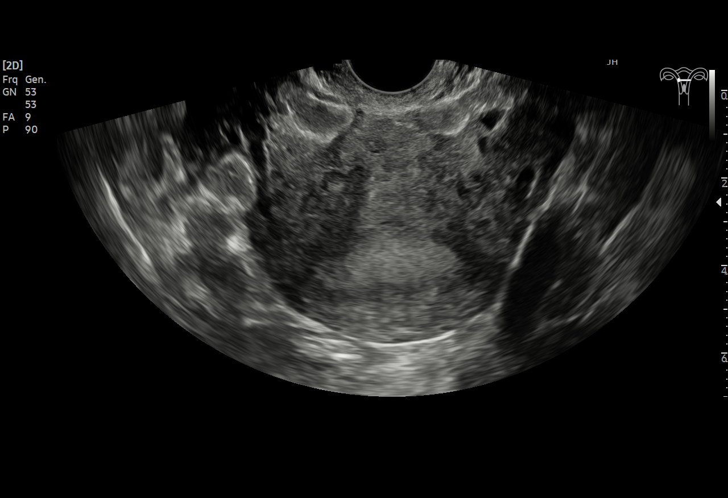
[im 108/186]
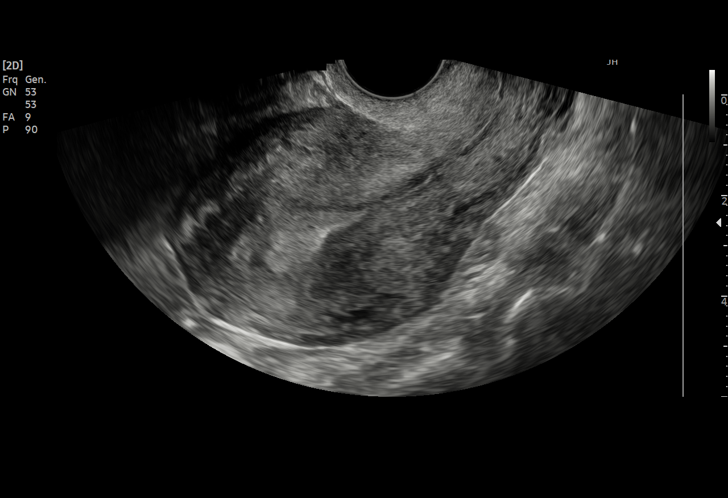
[im 116/186]
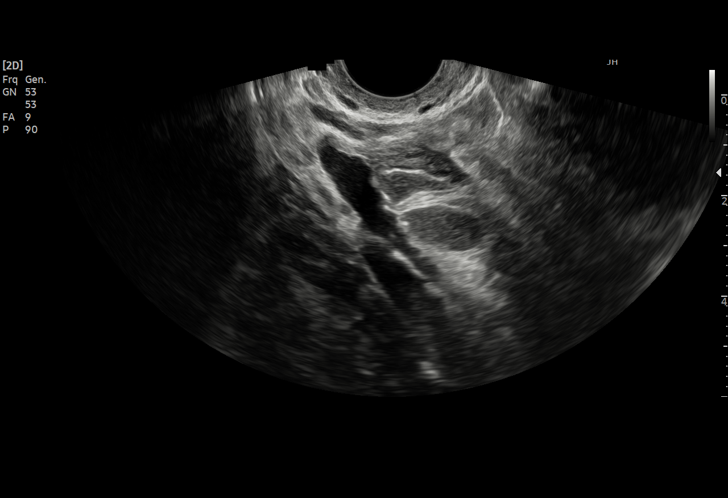
[im 132/186]
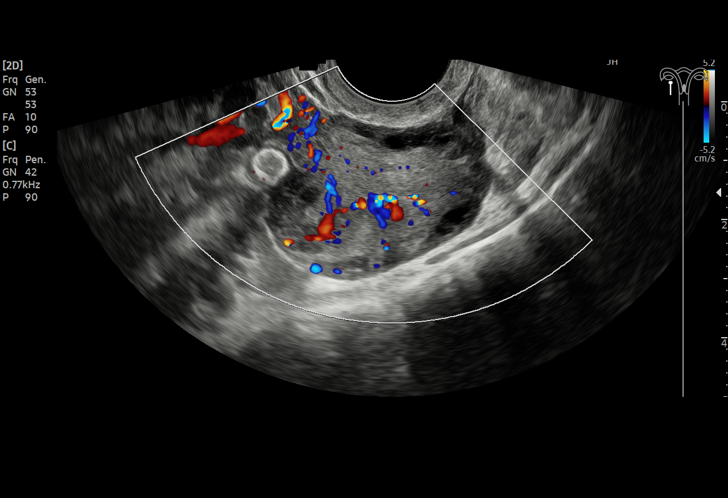
[im 147/186]
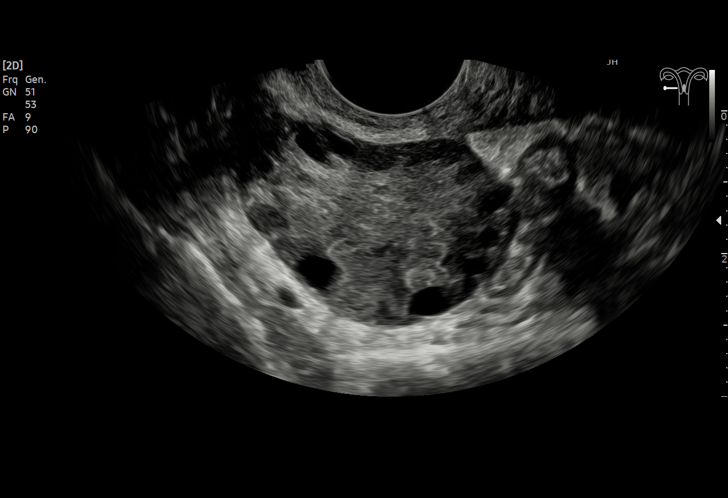
[im 155/186]
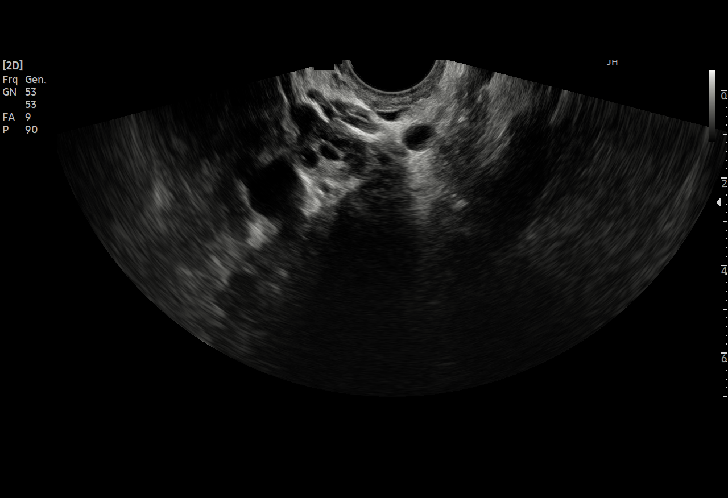
[im 170/186]
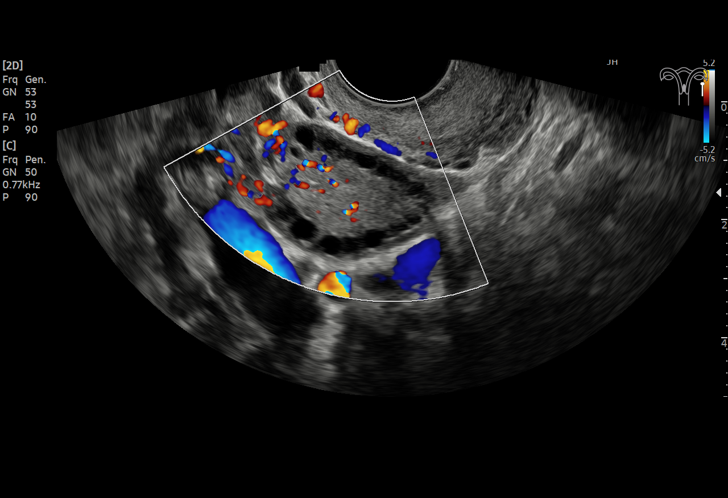
[im 186/186]
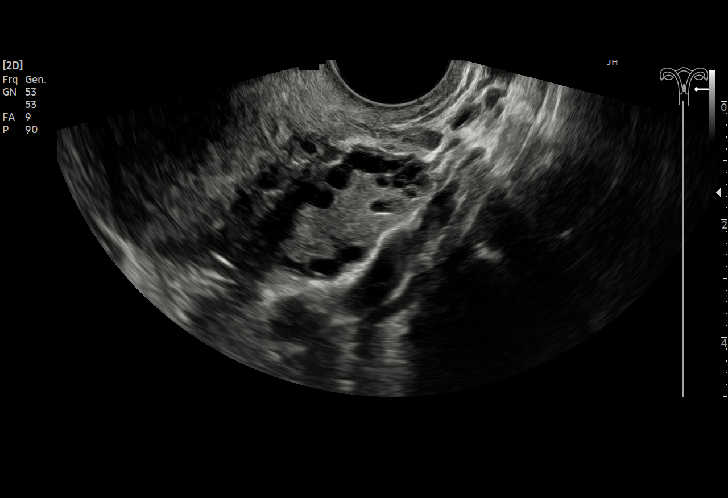

[15 of 25 positions shown; findings below may reference images not displayed]

FINDINGS: Uterus

Measurements: 8.7 x 4.7 x 6.7 cm = volume: 142 mL. No fibroids or
other mass visualized.

Endometrium

Thickness: 10.3 mm.  No focal abnormality visualized.

Right ovary

Measurements: 3.9 x 2.5 x 4.0 cm = volume: 20 mL. Contains numerous
small follicles in the periphery. A complex mass measuring 2.1 x
x 1.9 cm is seen in the right ovary.

Left ovary

Measurements: 3.4 x 2.0 x 1.7 cm = volume: 6 mL. Contains numerous
peripheral follicles.

Other findings

No abnormal free fluid.
IMPRESSION: 1. A complex 2.1 cm mass in the right ovary is indeterminate but may
represent corpus luteum cyst or hemorrhagic follicle. Recommend a
follow-up ultrasound in 6-12 weeks to ensure resolution.
2. Numerous small follicles are seen in the peripheries of both
ovaries. This raises the possibility of PCOS. Recommend clinical
correlation.
3. Uterus and endometrium are normal.

These results will be called to the ordering clinician or
representative by the Radiologist Assistant, and communication
documented in the PACS or [REDACTED].

## 2022-11-29 ENCOUNTER — Ambulatory Visit: Payer: Medicaid Other | Admitting: Obstetrics and Gynecology

## 2022-11-30 ENCOUNTER — Ambulatory Visit
Admission: RE | Admit: 2022-11-30 | Discharge: 2022-11-30 | Disposition: A | Discharge status: Home / Self Care | Payer: Medicaid Other | Source: Ambulatory Visit | Attending: Emergency Medicine | Admitting: Emergency Medicine

## 2022-11-30 VITALS — BP 123/84 | HR 89 | Temp 98.1°F | Resp 16

## 2022-11-30 DIAGNOSIS — Z3202 Encounter for pregnancy test, result negative: Secondary | ICD-10-CM | POA: Diagnosis not present

## 2022-11-30 DIAGNOSIS — Z113 Encounter for screening for infections with a predominantly sexual mode of transmission: Secondary | ICD-10-CM | POA: Diagnosis present

## 2022-11-30 LAB — POCT URINE PREGNANCY: Preg Test, Ur: NEGATIVE

## 2022-11-30 NOTE — ED Triage Notes (Signed)
Pt wants to be checked for STDs due to possible exposure from partner.

## 2022-11-30 NOTE — Discharge Instructions (Addendum)
Your tests are pending.  If your test results are positive, we will call you.  You and your sexual partner(s) may require treatment at that time.  Do not have sexual activity for at least 7 days.    Your pregnancy test is negative.

## 2022-11-30 NOTE — ED Provider Notes (Signed)
[name]    CSN: [id number] Arrival date & time: 11/30/22  1200      History   Chief Complaint Chief Complaint  Patient presents with   Exposure to STD    HPI Paige [name] is a 32 y.o. female.  Patient presents with request for STD testing.  She reports possible exposure from an unfaithful partner.  She states she is asymptomatic.  She denies vaginal discharge, pelvic pain, abdominal pain, dysuria, or other symptoms.  Her medical history includes PCOS.  The history is provided by the patient and medical records.    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   Rectal bleeding 03/11/2020   PCOS (polycystic ovarian syndrome) 03/29/2019    Past Surgical History:  Procedure Laterality Date   DILATION AND CURETTAGE OF UTERUS      OB History     Gravida  5   Para  1   Term  1   Preterm      AB  4   Living  1      SAB  1   IAB  3   Ectopic      Multiple      Live Births  1            Home Medications    Prior to Admission medications   Not on File    Family History Family History  Problem Relation Age of Onset   Colon cancer Maternal Grandmother        not sure of age    Social History Social History   Tobacco Use   Smoking status: Never   Smokeless tobacco: Never  Vaping Use   Vaping Use: Never used  Substance Use Topics   Alcohol use: Not Currently    Comment: occasional   Drug use: Yes    Types: Marijuana    Comment: occasional     Allergies   Cherry and Latex   Review of Systems Review of Systems  Constitutional:  Negative for chills and fever.  Gastrointestinal:  Negative for abdominal pain, nausea and vomiting.  Genitourinary:  Negative for dysuria, flank pain, hematuria, pelvic pain and vaginal discharge.  Skin:  Negative for color change and rash.  All other systems  reviewed and are negative.    Physical Exam Triage Vital Signs ED Triage Vitals  Enc Vitals Group     BP      Pulse      Resp      Temp      Temp src      SpO2      Weight      Height      Head Circumference      Peak Flow      Pain Score      Pain Loc      Pain Edu?      Excl. in [place]?    No data found.  Updated Vital Signs BP 123/84 (BP Location: Right Arm)   Pulse 89   Temp 98.1 F (36.7 C) (Tympanic)   Resp 16   LMP 10/10/2022 (Approximate)   SpO2 96%   Visual Acuity Right Eye Distance:   Left Eye Distance:   Bilateral Distance:    Right Eye Near:   Left Eye Near:    Bilateral Near:     Physical  Exam Vitals and nursing note reviewed.  Constitutional:      General: She is not in acute distress.    Appearance: Normal appearance. She is well-developed. She is not ill-appearing.  HENT:     Mouth/Throat:     Mouth: Mucous membranes are moist.  Cardiovascular:     Rate and Rhythm: Normal rate and regular rhythm.     Heart sounds: Normal heart sounds.  Pulmonary:     Effort: Pulmonary effort is normal. No respiratory distress.     Breath sounds: Normal breath sounds.  Abdominal:     General: Bowel sounds are normal.     Palpations: Abdomen is soft.     Tenderness: There is no abdominal tenderness. There is no right CVA tenderness, left CVA tenderness, guarding or rebound.  Musculoskeletal:     Cervical back: Neck supple.  Skin:    General: Skin is warm and dry.  Neurological:     Mental Status: She is alert.  Psychiatric:        Mood and Affect: Mood normal.        Behavior: Behavior normal.      UC Treatments / Results  Labs (all labs ordered are listed, but only abnormal results are displayed) Labs Reviewed  RPR  HIV ANTIBODY (ROUTINE TESTING W REFLEX)  POCT URINE PREGNANCY  CERVICOVAGINAL ANCILLARY ONLY    EKG   Radiology No results found.  Procedures Procedures (including critical care time)  Medications Ordered in  UC Medications - No data to display  Initial Impression / Assessment and Plan / UC Course  I have reviewed the triage vital signs and the nursing notes.  Pertinent labs & imaging results that were available during my care of the patient were reviewed by me and considered in my medical decision making (see chart for details).    STD screening, Negative pregnancy test.   Patient obtained vaginal self swab for testing.  HIV and syphilis pending also.  Discussed that we will call if test results are positive.  Discussed that she may require treatment at that time.  Discussed that sexual partner(s) may also require treatment.  Instructed patient to abstain from sexual activity for at least 7 days.  Instructed her to follow-up with her PCP or gynecologist.  Patient agrees to plan of care.   Final Clinical Impressions(s) / UC Diagnoses   Final diagnoses:  Screening for STD (sexually transmitted disease)  Negative pregnancy test     Discharge Instructions      Your tests are pending.  If your test results are positive, we will call you.  You and your sexual partner(s) may require treatment at that time.  Do not have sexual activity for at least 7 days.    Your pregnancy test is negative.         ED Prescriptions   None    PDMP not reviewed this encounter.   Sharion Balloon, NP 11/30/22 1324

## 2022-12-01 LAB — CERVICOVAGINAL ANCILLARY ONLY
Bacterial Vaginitis (gardnerella): NEGATIVE
Candida Glabrata: NEGATIVE
Candida Vaginitis: NEGATIVE
Chlamydia: NEGATIVE
Comment: NEGATIVE
Comment: NEGATIVE
Comment: NEGATIVE
Comment: NEGATIVE
Comment: NEGATIVE
Comment: NORMAL
Neisseria Gonorrhea: NEGATIVE
Trichomonas: NEGATIVE

## 2022-12-01 LAB — RPR: RPR Ser Ql: NONREACTIVE

## 2022-12-01 LAB — HIV ANTIBODY (ROUTINE TESTING W REFLEX): HIV Screen 4th Generation wRfx: NONREACTIVE

## 2023-02-26 ENCOUNTER — Emergency Department: Payer: Medicaid Other

## 2023-02-26 ENCOUNTER — Other Ambulatory Visit: Payer: Self-pay

## 2023-02-26 ENCOUNTER — Emergency Department
Admission: EM | Admit: 2023-02-26 | Discharge: 2023-02-26 | Discharge status: Against Medical Advice | Payer: Medicaid Other | Attending: Emergency Medicine | Admitting: Emergency Medicine

## 2023-02-26 DIAGNOSIS — R1030 Lower abdominal pain, unspecified: Secondary | ICD-10-CM | POA: Diagnosis present

## 2023-02-26 LAB — WET PREP, GENITAL
Clue Cells Wet Prep HPF POC: NONE SEEN
Trich, Wet Prep: NONE SEEN
WBC, Wet Prep HPF POC: <10 (ref <10)
Yeast Wet Prep HPF POC: NONE SEEN

## 2023-02-26 LAB — COMPREHENSIVE METABOLIC PANEL
ALT: 16 U/L (ref 0–44)
AST: 15 U/L (ref 15–41)
Albumin: 3.8 g/dL (ref 3.5–5.0)
Alkaline Phosphatase: 72 U/L (ref 38–126)
Anion gap: 5 (ref 5–15)
BUN: 13 mg/dL (ref 6–20)
CO2: 23 mmol/L (ref 22–32)
Calcium: 8.3 mg/dL — ABNORMAL LOW (ref 8.9–10.3)
Chloride: 107 mmol/L (ref 98–111)
Creatinine, Ser: 0.78 mg/dL (ref 0.44–1.00)
GFR, Estimated: >60 mL/min (ref >60)
Glucose, Bld: 106 mg/dL — ABNORMAL HIGH (ref 70–99)
Potassium: 3.6 mmol/L (ref 3.5–5.1)
Sodium: 135 mmol/L (ref 135–145)
Total Bilirubin: 0.3 mg/dL (ref 0.3–1.2)
Total Protein: 6.5 g/dL (ref 6.5–8.1)

## 2023-02-26 LAB — CBC
HCT: 41.6 % (ref 36.0–46.0)
Hemoglobin: 13.9 g/dL (ref 12.0–15.0)
MCH: 29.1 pg (ref 26.0–34.0)
MCHC: 33.4 g/dL (ref 30.0–36.0)
MCV: 87 fL (ref 80.0–100.0)
Platelets: 292 10*3/uL (ref 150–400)
RBC: 4.78 MIL/uL (ref 3.87–5.11)
RDW: 12.4 % (ref 11.5–15.5)
WBC: 8.4 10*3/uL (ref 4.0–10.5)
nRBC: 0 % (ref 0.0–0.2)

## 2023-02-26 LAB — URINALYSIS, ROUTINE W REFLEX MICROSCOPIC
Bilirubin Urine: NEGATIVE
Glucose, UA: NEGATIVE mg/dL
Ketones, ur: NEGATIVE mg/dL
Leukocytes,Ua: NEGATIVE
Nitrite: NEGATIVE
Protein, ur: NEGATIVE mg/dL
Specific Gravity, Urine: 1.015 (ref 1.005–1.030)
pH: 6 (ref 5.0–8.0)

## 2023-02-26 LAB — CHLAMYDIA/NGC RT PCR (ARMC ONLY)
Chlamydia Tr: NOT DETECTED
N gonorrhoeae: NOT DETECTED

## 2023-02-26 LAB — PREGNANCY, URINE: Preg Test, Ur: NEGATIVE

## 2023-02-26 LAB — LIPASE, BLOOD: Lipase: 41 U/L (ref 11–51)

## 2023-02-26 NOTE — ED Notes (Addendum)
RN attempted to call pt (3rd time) no answer. Pt not in room. Bathrooms checked. Pt belongings not present in room.

## 2023-02-26 NOTE — ED Triage Notes (Signed)
Pt states coming in with abdominal pain to the lower abdomen that radiates in the RLQ. Pt states pain for the last 2 days with nausea and vomiting, but denies diarrhea. Pt state appendix is still present.

## 2023-02-26 NOTE — ED Provider Notes (Signed)
[facility] Provider Note    Event Date/Time   First MD Initiated Contact with Patient 02/26/23 0715     (approximate)   History   Abdominal Pain   HPI  Paige [name] is a 32 y.o. female reports she has been having intermittent mild sharp pains in her lower pelvis for about 2 days.   She reports she has not had a period since May about the first week.  This is atypical for her.  She does have a history of polycystic ovaries.  In addition, she has been pregnant once in the past and reports she has a 33 year old boy.  She does not believe she is pregnant.  She has not had no pelvic discharge or bleeding.  No nausea vomiting or diarrhea.  The pain is mostly located over the middle of the lower abdomen/pelvis  No nausea or vomiting.  She does not wish for anything for pain or discomfort at this time  No fever.  No pain or burning with urination  Physical Exam   Triage Vital Signs: ED Triage Vitals  Enc Vitals Group     BP 02/26/23 0704 121/76     Pulse Rate 02/26/23 0704 79     Resp 02/26/23 0704 16     Temp 02/26/23 0704 98.1 F (36.7 C)     Temp src --      SpO2 02/26/23 0704 99 %     Weight 02/26/23 0705 152 lb (68.9 kg)     Height 02/26/23 0705 5\' 3"  (1.6 m)     Head Circumference --      Peak Flow --      Pain Score 02/26/23 0704 8     Pain Loc --      Pain Edu? --      Excl. in [place]? --     Most recent vital signs: Vitals:   02/26/23 0704  BP: 121/76  Pulse: 79  Resp: 16  Temp: 98.1 F (36.7 C)  SpO2: 99%     General: Awake, no distress.  CV:  Good peripheral perfusion.  Resp:  Normal effort.  Abd:  No distention.  Soft nontender throughout examination of the upper abdomen.  She reports mild tenderness with palpation deep in the lower quadrants bilaterally.  She reports the majority of discomfort is located over the suprapubic region to palpation. She reports mild discomfort at McBurney's point but reports it has more  discomfort in the suprapubic region  Other:  Lower extremities warm well-perfused.  Speculum exam shows normal appearance of vaginal mucosa no bleeding.  There is small to moderate amount of whitish discharge in the vaginal vault as well as os.  No cervical motion tenderness to examination of the cervix on digital exam.  No  adnexal tenderness noted.   ED Results / Procedures / Treatments   Labs (all labs ordered are listed, but only abnormal results are displayed) Labs Reviewed  COMPREHENSIVE METABOLIC PANEL - Abnormal; Notable for the following components:      Result Value   Glucose, Bld 106 (*)    Calcium 8.3 (*)    All other components within normal limits  URINALYSIS, ROUTINE W REFLEX MICROSCOPIC - Abnormal; Notable for the following components:   Color, Urine YELLOW (*)    APPearance HAZY (*)    Hgb urine dipstick SMALL (*)    Bacteria, UA FEW (*)    All other components within normal limits  WET PREP, GENITAL  CHLAMYDIA/NGC RT  PCR ([facility] ONLY)            LIPASE, BLOOD  CBC  PREGNANCY, URINE  HIV ANTIBODY (ROUTINE TESTING W REFLEX)  POC URINE PREG, ED   Labs interpreted as negative pregnancy test.  CBC is normal. Comprehensive metabolic panel essentially normal except for mild hypocalcemia Urinalysis with some contamination but no clear evidence of infection  RADIOLOGY  Ultrasound was ordered but not performed as patient left the ER   PROCEDURES:  Critical Care performed: No  Procedures   MEDICATIONS ORDERED IN ED: Medications - No data to display   IMPRESSION / MDM / ASSESSMENT AND PLAN / ED COURSE  I reviewed the triage vital signs and the nursing notes.                              Patient regnancy test is negative.  Differential diagnosis includes, but is not limited to, ovarian cyst, fibroid, irregular menses, pelvic infection, BV, UTI, etc.  Based on the patient's lower somewhat sharp pelvic discomfort and her exam with slight discharge we will  await testing from her gonorrhea chlamydia and wet prep.  Additionally we will perform transvaginal ultrasound to assess for possible ovarian cyst and evaluate for good flow and no evidence of torsion.  Given the patient's pain is mild intermittent sharp I have doubt clinically that she has an acute torsion but think it is prudent to follow-up with ultrasound imaging as well  If her ultrasound is normal, I have discussed with the patient and given her lower abdominal pain we will proceed with CT imaging to exclude other intra-abdominal causes such as appendicitis, colitis, diverticulitis, etc.  Overall her exam very reassuring.  No evidence of sepsis.  Hemodynamics and mental status are normal.  Nontoxic well-appearing.  Patient's presentation is most consistent with acute complicated illness / injury requiring diagnostic workup.    Clinical Course as of 02/26/23 1610  Wynelle Link Feb 26, 2023  0805 Pelvic exam performed by me with patient's verbal consent.  Performed with nurse Sydell Axon present throughout. [MQ]    Clinical Course User Index [MQ] Sharyn Creamer, MD   ----------------------------------------- 9:26 AM on 02/26/2023 ----------------------------------------- RN Attempted to call patient twice, no answer.  Patient not in room and ultrasound technician notified nurse that she believes the patient had eloped.   Is not clear why the patient left.  Both nurse and I have gone over the plan of care with her and it was quite clear that we are waiting on ultrasound to evaluate for issues such as cyst or twisted ovary, or other concerns that might reveal cause for her pelvic pain.  We also discussed that if her ultrasound did not show abnormality that I had recommended a CT scan.  Patient did not wish for any medications.  At this point she has not returned to the treatment room, and presumptively she has eloped.  At this juncture, unclear why patient's left abruptly.  She knew that we had pending  workup to complete, she did not notify anyone of her leaving, and we have waited approximately 20+ minutes for her to return.  Attempted to call her with no answer.  I do not believe the patient had evidence that would support an obvious emergent cause for her pelvic pain, and I have a low pretest probability that she had an issue like a torsion or appendicitis but we are still wishing to exclude these with more certainty  and testing.  Nonetheless, no further evaluation could be completed as the patient has eloped  Wet prep normal.  Gonorrhea chlamydia test pending, will be followed up by nurse navigator  FINAL CLINICAL IMPRESSION(S) / ED DIAGNOSES   Final diagnoses:  Lower abdominal pain     Rx / DC Orders   ED Discharge Orders     None        Note:  This document was prepared using Dragon voice recognition software and may include unintentional dictation errors.   Sharyn Creamer, MD 02/26/23 [phone]

## 2023-02-26 NOTE — ED Notes (Signed)
RN attempted to call pt cell phone. No answer.

## 2023-02-26 NOTE — ED Notes (Signed)
Pt not found in room at this time. RN attempting to call pt. No answer at this time.

## 2023-05-12 ENCOUNTER — Ambulatory Visit: Payer: Self-pay

## 2023-06-07 ENCOUNTER — Ambulatory Visit
Admission: RE | Admit: 2023-06-07 | Discharge: 2023-06-07 | Disposition: A | Discharge status: Home / Self Care | Payer: Medicaid Other | Source: Ambulatory Visit | Attending: Internal Medicine | Admitting: Internal Medicine

## 2023-06-07 ENCOUNTER — Ambulatory Visit: Payer: Self-pay

## 2023-06-07 ENCOUNTER — Other Ambulatory Visit: Payer: Self-pay

## 2023-06-07 VITALS — BP 108/70 | HR 64 | Temp 98.1°F | Resp 16

## 2023-06-07 DIAGNOSIS — Z113 Encounter for screening for infections with a predominantly sexual mode of transmission: Secondary | ICD-10-CM

## 2023-06-07 DIAGNOSIS — Z3202 Encounter for pregnancy test, result negative: Secondary | ICD-10-CM | POA: Insufficient documentation

## 2023-06-07 DIAGNOSIS — E282 Polycystic ovarian syndrome: Secondary | ICD-10-CM | POA: Diagnosis not present

## 2023-06-07 DIAGNOSIS — Z202 Contact with and (suspected) exposure to infections with a predominantly sexual mode of transmission: Secondary | ICD-10-CM | POA: Insufficient documentation

## 2023-06-07 LAB — POCT URINE PREGNANCY: Preg Test, Ur: NEGATIVE

## 2023-06-07 NOTE — Discharge Instructions (Signed)
Pregnancy test was negative.  STD testing is pending.

## 2023-06-07 NOTE — ED Provider Notes (Signed)
[facility]    CSN: [id number] Arrival date & time: 06/07/23  1048      History   Chief Complaint Chief Complaint  Patient presents with   Possible Pregnancy    And std screening - Entered by patient   Exposure to STD    HPI Versa Craton is a 32 y.o. female.   Patient presents today for pregnancy and STD testing.  Patient reports her last menstrual cycle was 04/14/2023.  Although, she reports that irregular menstrual cycles are baseline for her as she has PCOS.  Patient denies any obvious exposure to STD but reports that her boyfriend has been unfaithful, and she was going through his phone where she noticed that another girl texted him to tell him that he needed to get tested for STDs.  Patient denies any associated symptoms.   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   Rectal bleeding 03/11/2020   PCOS (polycystic ovarian syndrome) 03/29/2019    Past Surgical History:  Procedure Laterality Date   DILATION AND CURETTAGE OF UTERUS      OB History     Gravida  5   Para  1   Term  1   Preterm      AB  4   Living  1      SAB  1   IAB  3   Ectopic      Multiple      Live Births  1            Home Medications    Prior to Admission medications   Not on File    Family History Family History  Problem Relation Age of Onset   Colon cancer Maternal Grandmother        not sure of age    Social History Social History   Tobacco Use   Smoking status: Never   Smokeless tobacco: Never  Vaping Use   Vaping status: Never Used  Substance Use Topics   Alcohol use: Not Currently    Comment: occasional   Drug use: Not Currently    Types: Marijuana    Comment: occasional     Allergies   Cherry and Latex   Review of Systems Review of Systems Per  HPI  Physical Exam Triage Vital Signs ED Triage Vitals  Encounter Vitals Group     BP 06/07/23 1059 108/70     Systolic BP Percentile --      Diastolic BP Percentile --      Pulse Rate 06/07/23 1059 64     Resp 06/07/23 1059 16     Temp 06/07/23 1059 98.1 F (36.7 C)     Temp Source 06/07/23 1059 Oral     SpO2 06/07/23 1059 98 %     Weight --      Height --      Head Circumference --      Peak Flow --      Pain Score 06/07/23 1055 0     Pain Loc --      Pain Education --      Exclude from Growth Chart --    No data found.  Updated Vital Signs BP 108/70 (BP Location: Left Arm)   Pulse 64   Temp 98.1 F (36.7 C) (Oral)  Resp 16   LMP 04/08/2023 (Approximate)   SpO2 98%   Visual Acuity Right Eye Distance:   Left Eye Distance:   Bilateral Distance:    Right Eye Near:   Left Eye Near:    Bilateral Near:     Physical Exam Constitutional:      General: She is not in acute distress.    Appearance: Normal appearance. She is not toxic-appearing or diaphoretic.  HENT:     Head: Normocephalic and atraumatic.  Eyes:     Extraocular Movements: Extraocular movements intact.     Conjunctiva/sclera: Conjunctivae normal.  Pulmonary:     Effort: Pulmonary effort is normal.  Genitourinary:    Comments: Deferred with shared decision making.  Self swab performed. Neurological:     General: No focal deficit present.     Mental Status: She is alert and oriented to person, place, and time. Mental status is at baseline.  Psychiatric:        Mood and Affect: Mood normal.        Behavior: Behavior normal.        Thought Content: Thought content normal.        Judgment: Judgment normal.      UC Treatments / Results  Labs (all labs ordered are listed, but only abnormal results are displayed) Labs Reviewed  HIV ANTIBODY (ROUTINE TESTING W REFLEX)  RPR  POCT URINE PREGNANCY  CERVICOVAGINAL ANCILLARY ONLY    EKG   Radiology No results  found.  Procedures Procedures (including critical care time)  Medications Ordered in UC Medications - No data to display  Initial Impression / Assessment and Plan / UC Course  I have reviewed the triage vital signs and the nursing notes.  Pertinent labs & imaging results that were available during my care of the patient were reviewed by me and considered in my medical decision making (see chart for details).     Urine pregnancy test negative.  Patient reports irregular menstrual cycles at baseline so do not think that quantitative hCG is necessary at this time.  Encouraged follow-up with gynecology if she continues to miss menstrual cycle.  Cytology swab to test for gonorrhea, chlamydia, trichomonas pending.  Will defer BV and Candida evaluation given she is asymptomatic.  HIV and RPR test also pending.  Advised strict follow-up precautions.  Patient verbalized understanding and was agreeable with plan. Final Clinical Impressions(s) / UC Diagnoses   Final diagnoses:  Urine pregnancy test negative  Screening examination for venereal disease     Discharge Instructions      Pregnancy test was negative.  STD testing is pending.    ED Prescriptions   None    PDMP not reviewed this encounter.   Gustavus Bryant, [place] 06/07/23 1153

## 2023-06-07 NOTE — ED Triage Notes (Signed)
Pt reports possible exposure to STD. Denies Sx

## 2023-06-08 LAB — CERVICOVAGINAL ANCILLARY ONLY
Chlamydia: NEGATIVE
Comment: NEGATIVE
Comment: NEGATIVE
Comment: NORMAL
Neisseria Gonorrhea: NEGATIVE
Trichomonas: NEGATIVE

## 2023-06-08 LAB — RPR: RPR Ser Ql: NONREACTIVE

## 2023-06-08 LAB — HIV ANTIBODY (ROUTINE TESTING W REFLEX): HIV Screen 4th Generation wRfx: NONREACTIVE

## 2023-07-03 ENCOUNTER — Other Ambulatory Visit: Payer: Self-pay

## 2023-07-03 ENCOUNTER — Ambulatory Visit: Payer: Self-pay

## 2023-07-03 ENCOUNTER — Ambulatory Visit
Admission: RE | Admit: 2023-07-03 | Discharge: 2023-07-03 | Disposition: A | Discharge status: Home / Self Care | Payer: Medicaid Other | Source: Ambulatory Visit | Attending: Internal Medicine | Admitting: Internal Medicine

## 2023-07-03 VITALS — BP 118/79 | HR 81 | Temp 98.2°F | Resp 14

## 2023-07-03 DIAGNOSIS — N898 Other specified noninflammatory disorders of vagina: Secondary | ICD-10-CM | POA: Diagnosis present

## 2023-07-03 DIAGNOSIS — Z113 Encounter for screening for infections with a predominantly sexual mode of transmission: Secondary | ICD-10-CM | POA: Insufficient documentation

## 2023-07-03 DIAGNOSIS — B9689 Other specified bacterial agents as the cause of diseases classified elsewhere: Secondary | ICD-10-CM | POA: Diagnosis not present

## 2023-07-03 DIAGNOSIS — B3731 Acute candidiasis of vulva and vagina: Secondary | ICD-10-CM | POA: Insufficient documentation

## 2023-07-03 DIAGNOSIS — N760 Acute vaginitis: Secondary | ICD-10-CM

## 2023-07-03 MED ORDER — FLUCONAZOLE 150 MG PO TABS: ORAL_TABLET | ORAL | 1 refills | Status: DC

## 2023-07-03 MED ORDER — METRONIDAZOLE 500 MG PO TABS: 500.0000 mg | ORAL_TABLET | Freq: Two times a day (BID) | ORAL | 0 refills | Status: DC

## 2023-07-03 NOTE — ED Provider Notes (Signed)
[facility]  Note:  This document was prepared using Conservation officer, historic buildings and may include unintentional dictation errors.  MRN: [id number] DOB: 1991-06-19  Subjective:   Jaleisa Brose is a 32 y.o. female presenting for 1 day history of vaginal discharge, bad vaginal odor, vaginal itching.  Has concerns about sexually transmitted infection, BV and yeast infection.  She had sex with her previous female partner that she found out to be cheating on her.  This was after she last had STI testing.  Patient reports she has a history of bacterial vaginosis and regularly gets amoxicillin for the infection.  She is also requesting fluconazole as she gets yeast infection from antibiotics.  No current facility-administered medications for this encounter. No current outpatient medications on file.   Allergies  Allergen Reactions   Cherry Hives    Hives and swelling   Latex     Past Medical History:  Diagnosis Date   Miscarriage      Past Surgical History:  Procedure Laterality Date   DILATION AND CURETTAGE OF UTERUS      Family History  Problem Relation Age of Onset   Colon cancer Maternal Grandmother        not sure of age    Social History   Tobacco Use   Smoking status: Never   Smokeless tobacco: Never  Vaping Use   Vaping status: Never Used  Substance Use Topics   Alcohol use: Not Currently    Comment: occasional   Drug use: Not Currently    Types: Marijuana    Comment: occasional    ROS   Objective:   Vitals: BP 118/79 (BP Location: Right Arm)   Pulse 81   Temp 98.2 F (36.8 C) (Oral)   Resp 14   LMP 04/08/2023 (Approximate)   SpO2 96%   Physical Exam Constitutional:      General: She is not in acute distress.    Appearance: Normal appearance. She is well-developed. She is not ill-appearing, toxic-appearing or diaphoretic.  HENT:     Head: Normocephalic and atraumatic.     Nose: Nose normal.     Mouth/Throat:      Mouth: Mucous membranes are moist.  Eyes:     General: No scleral icterus.       Right eye: No discharge.        Left eye: No discharge.     Extraocular Movements: Extraocular movements intact.  Cardiovascular:     Rate and Rhythm: Normal rate.  Pulmonary:     Effort: Pulmonary effort is normal.  Skin:    General: Skin is warm and dry.  Neurological:     General: No focal deficit present.     Mental Status: She is alert and oriented to person, place, and time.  Psychiatric:        Mood and Affect: Mood normal.        Behavior: Behavior normal.     Assessment and Plan :   PDMP not reviewed this encounter.  1. Bacterial vaginosis   2. Acute vaginitis    Chart review shows that she consistently has gotten metronidazole.  Therefore this is what I will prescribe empirically for suspected recurrent bacterial vaginosis especially as amoxicillin does not cover for bacterial vaginosis.  Will provide with a prescription for fluconazole to cover for antibiotic associated yeast infection.  Treatment plan will be updated tomorrow as needed based off of the vaginal swab results.   Wallis Bamberg, PA-C  07/03/23 1621  

## 2023-07-03 NOTE — ED Triage Notes (Signed)
Patient states that she has some vaginal itching and odor x yesterday

## 2023-07-03 NOTE — Discharge Instructions (Signed)
I am prescribing you metronidazole and fluconazole for suspected bacterial vaginosis and yeast infection. We will update you with your test results at some point tomorrow and make changes as necessary.

## 2023-07-04 ENCOUNTER — Telehealth: Payer: Self-pay | Admitting: Urgent Care

## 2023-07-04 LAB — CERVICOVAGINAL ANCILLARY ONLY
Bacterial Vaginitis (gardnerella): POSITIVE — AB
Candida Glabrata: NEGATIVE
Candida Vaginitis: NEGATIVE
Chlamydia: NEGATIVE
Comment: NEGATIVE
Comment: NEGATIVE
Comment: NEGATIVE
Comment: NEGATIVE
Comment: NEGATIVE
Comment: NORMAL
Neisseria Gonorrhea: NEGATIVE
Trichomonas: NEGATIVE

## 2023-07-04 MED ORDER — METRONIDAZOLE 500 MG PO TABS: 500.0000 mg | ORAL_TABLET | Freq: Two times a day (BID) | ORAL | 0 refills | Status: DC

## 2023-07-04 NOTE — Telephone Encounter (Signed)
Take 1 tablet (500 mg total) by mouth 2 (two) times daily., Starting Mon 07/03/2023, Normal, Pharmacy: [facility] [id number] - [place], [place] - [address], Refills: 0 ordered, Ordered by Wallis Bamberg, PA-C on 07/03/2023 at 1620   Will resend at patient's request.

## 2023-07-21 ENCOUNTER — Encounter (HOSPITAL_COMMUNITY): Payer: Self-pay

## 2023-07-21 ENCOUNTER — Ambulatory Visit (HOSPITAL_COMMUNITY)
Admission: RE | Admit: 2023-07-21 | Discharge: 2023-07-21 | Disposition: A | Discharge status: Home / Self Care | Payer: Medicaid Other | Source: Ambulatory Visit | Attending: Internal Medicine

## 2023-07-21 VITALS — BP 120/81 | HR 59 | Temp 98.6°F | Resp 16 | Ht 63.0 in | Wt 142.0 lb

## 2023-07-21 DIAGNOSIS — Z113 Encounter for screening for infections with a predominantly sexual mode of transmission: Secondary | ICD-10-CM | POA: Diagnosis present

## 2023-07-21 DIAGNOSIS — E282 Polycystic ovarian syndrome: Secondary | ICD-10-CM | POA: Diagnosis not present

## 2023-07-21 DIAGNOSIS — R300 Dysuria: Secondary | ICD-10-CM | POA: Diagnosis not present

## 2023-07-21 HISTORY — DX: Polycystic ovarian syndrome: E28.2

## 2023-07-21 LAB — POCT URINE PREGNANCY: Preg Test, Ur: NEGATIVE

## 2023-07-21 NOTE — ED Triage Notes (Signed)
"  Retesting tested positive for bv was given meds just want to be sure it's gone - Entered by patient"  Patient states she never had any symptoms for her BV besides an odor. States she finished the medication. States she was told that she can get re-tested. States her partner was cheating on her as well and wanting follow up STD testing as well.

## 2023-07-21 NOTE — ED Provider Notes (Signed)
[facility]    CSN: [id number] Arrival date & time: 07/21/23  4098      History   Chief Complaint Chief Complaint  Patient presents with   Appointment   SEXUALLY TRANSMITTED DISEASE    HPI Lashona Hattabaugh is a 32 y.o. female.   HPI Wants to be retested for BV and STDs, also wants a pregnancy test.  Her boyfriend cheated on her was seen 2 weeks ago had testing and tested positive for BV, completed her treatment.  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 Authorizing Provider  fluconazole (DIFLUCAN) 150 MG tablet Take 1 tablet for antibiotic associated yeast infection.  You may refill this at the end of your antibiotic course if you continue to have vaginal itching. 07/03/23   Wallis Bamberg, PA-C  metroNIDAZOLE (FLAGYL) 500 MG tablet Take 1 tablet (500 mg total) by mouth 2 (two) times daily. 07/04/23   Wallis Bamberg, PA-C    Family History Family History  Problem Relation Age of Onset   Colon cancer Maternal Grandmother        not sure of age    Social History Social History   Tobacco Use   Smoking status: Never   Smokeless tobacco: Never  Vaping Use   Vaping status: Never Used  Substance Use Topics   Alcohol use:  Not Currently    Comment: occasional   Drug use: Not Currently    Types: Marijuana    Comment: occasional     Allergies   Cherry and Latex   Review of Systems Review of Systems  Genitourinary:  Positive for menstrual problem (irregular periods, LMP approx 2 months ago). Negative for dysuria, frequency, pelvic pain, vaginal discharge and vaginal pain.     Physical Exam Triage Vital Signs ED Triage Vitals [07/21/23 0935]  Encounter Vitals Group     BP 120/81     Systolic BP Percentile      Diastolic BP Percentile      Pulse Rate (!) 59     Resp 16     Temp 98.6 F (37 C)     Temp Source Oral     SpO2 98 %     Weight 142 lb (64.4 kg)     Height 5\' 3"  (1.6 m)     Head Circumference      Peak Flow      Pain Score 0     Pain Loc      Pain  Education      Exclude from Growth Chart    No data found.  Updated Vital Signs BP 120/81 (BP Location: Right Arm)   Pulse (!) 59   Temp 98.6 F (37 C) (Oral)   Resp 16   Ht 5\' 3"  (1.6 m)   Wt 142 lb (64.4 kg)   LMP  (LMP Unknown)   SpO2 98%   BMI 25.15 kg/m   Visual Acuity Right Eye Distance:   Left Eye Distance:   Bilateral Distance:    Right Eye Near:   Left Eye Near:    Bilateral Near:     Physical Exam Vitals and nursing note reviewed.  Constitutional:      Appearance: Normal appearance.  HENT:     Head: Normocephalic and atraumatic.     Right Ear: Tympanic membrane and ear canal normal.     Left Ear: Tympanic membrane and ear canal normal.  Cardiovascular:     Rate and Rhythm: Normal rate and regular rhythm.     Heart sounds: Normal heart sounds.  Pulmonary:     Effort: Pulmonary effort is normal.     Breath sounds: Normal breath sounds.  Abdominal:     General: Bowel sounds are normal.     Palpations: Abdomen is soft.     Tenderness: There is no abdominal tenderness. There is no guarding.  Skin:    General: Skin is warm and dry.  Neurological:     Mental Status: She is alert.      UC  Treatments / Results  Labs (all labs ordered are listed, but only abnormal results are displayed) Labs Reviewed  POCT URINE PREGNANCY  CERVICOVAGINAL ANCILLARY ONLY    EKG   Radiology No results found.  Procedures Procedures (including critical care time)  Medications Ordered in UC Medications - No data to display  Initial Impression / Assessment and Plan / UC Course  I have reviewed the triage vital signs and the nursing notes.  Pertinent labs & imaging results that were available during my care of the patient were reviewed by me and considered in my medical decision making (see chart for details).     32 year old female wants repeat testing for STI, boyfriend cheated on her, recently tested positive for BV completed her treatment.  Also wants a pregnancy test due to irregular periods.  Final Clinical Impressions(s) / UC Diagnoses   Final diagnoses:  Screening examination for STI     Discharge Instructions      Test results will be released to your MyChart account We will contact you if anything is positive and requires treatment.      ED Prescriptions   None    PDMP not reviewed this encounter.   Meliton Rattan, [place] 07/21/23 [phone]

## 2023-07-21 NOTE — Discharge Instructions (Addendum)
Test results will be released to your MyChart account We will contact you if anything is positive and requires treatment.

## 2023-07-24 LAB — CERVICOVAGINAL ANCILLARY ONLY
Bacterial Vaginitis (gardnerella): POSITIVE — AB
Candida Glabrata: NEGATIVE
Candida Vaginitis: NEGATIVE
Chlamydia: NEGATIVE
Comment: NEGATIVE
Comment: NEGATIVE
Comment: NEGATIVE
Comment: NEGATIVE
Comment: NEGATIVE
Comment: NORMAL
Neisseria Gonorrhea: NEGATIVE
Trichomonas: NEGATIVE

## 2023-07-25 ENCOUNTER — Ambulatory Visit (INDEPENDENT_AMBULATORY_CARE_PROVIDER_SITE_OTHER): Payer: Medicaid Other | Admitting: Family Medicine

## 2023-07-25 ENCOUNTER — Encounter: Payer: Self-pay | Admitting: Family Medicine

## 2023-07-25 VITALS — BP 114/76 | HR 65 | Temp 98.1°F | Resp 16 | Wt 153.4 lb

## 2023-07-25 DIAGNOSIS — F419 Anxiety disorder, unspecified: Secondary | ICD-10-CM

## 2023-07-25 DIAGNOSIS — E282 Polycystic ovarian syndrome: Secondary | ICD-10-CM | POA: Diagnosis not present

## 2023-07-25 DIAGNOSIS — Z7689 Persons encountering health services in other specified circumstances: Secondary | ICD-10-CM

## 2023-07-25 DIAGNOSIS — F32A Depression, unspecified: Secondary | ICD-10-CM

## 2023-07-25 MED ORDER — METFORMIN HCL ER 500 MG PO TB24: 500.0000 mg | ORAL_TABLET | Freq: Every day | ORAL | 1 refills | Status: DC

## 2023-07-25 MED ORDER — SERTRALINE HCL 25 MG PO TABS
25.0000 mg | ORAL_TABLET | Freq: Every day | ORAL | 1 refills | Status: DC
Start: 2023-07-25 — End: 2023-10-20

## 2023-07-31 ENCOUNTER — Encounter: Payer: Self-pay | Admitting: Family Medicine

## 2023-07-31 NOTE — Progress Notes (Signed)
New Patient Office Visit  Subjective    Patient ID: Paige [name], female    DOB: 09-30-1990  Age: 32 y.o. MRN: [id number]  CC:  Chief Complaint  Patient presents with   Establish Care    HPI Paige [name] presents to establish care and for review of medical concerns. Patient reports that she has a history of PCOS. She is on no meds.    Outpatient Encounter Medications as of 07/25/2023  Medication Sig   fluconazole (DIFLUCAN) 150 MG tablet Take 1 tablet for antibiotic associated yeast infection.  You may refill this at the end of your antibiotic course if you continue to have vaginal itching.   metFORMIN (GLUCOPHAGE-XR) 500 MG 24 hr tablet Take 1 tablet (500 mg total) by mouth daily with breakfast.   metroNIDAZOLE (FLAGYL) 500 MG tablet Take 1 tablet (500 mg total) by mouth 2 (two) times daily.   sertraline (ZOLOFT) 25 MG tablet Take 1 tablet (25 mg total) by mouth daily.   No facility-administered encounter medications on file as of 07/25/2023.    Past Medical History:  Diagnosis Date   Miscarriage    PCOS (polycystic ovarian syndrome)     Past Surgical History:  Procedure Laterality Date   DILATION AND CURETTAGE OF UTERUS      Family History  Problem Relation Age of Onset   Colon cancer Maternal Grandmother        not sure of age    Social History   Socioeconomic History   Marital status: Single    Spouse name: Not on file   Number of children: Not on file   Years of education: Not on file   Highest education level: Not on file  Occupational History   Not on file  Tobacco Use   Smoking status: Never   Smokeless tobacco: Never  Vaping Use   Vaping status: Never Used  Substance and Sexual Activity   Alcohol use: Not Currently    Comment: occasional   Drug use: Not Currently    Types: Marijuana    Comment: occasional   Sexual activity: Yes    Birth control/protection: None  Other Topics Concern   Not on file  Social History Narrative   Not on  file   Social Determinants of Health   Financial Resource Strain: Low Risk  (07/25/2023)   Overall Financial Resource Strain (CARDIA)    Difficulty of Paying Living Expenses: Not hard at all  Food Insecurity: No Food Insecurity (07/25/2023)   Hunger Vital Sign    Worried About Running Out of Food in the Last Year: Never true    Ran Out of Food in the Last Year: Never true  Transportation Needs: No Transportation Needs (07/25/2023)   PRAPARE - Administrator, Civil Service (Medical): No    Lack of Transportation (Non-Medical): No  Physical Activity: Sufficiently Active (07/25/2023)   Exercise Vital Sign    Days of Exercise per Week: 7 days    Minutes of Exercise per Session: 30 min  Stress: Stress Concern Present (07/25/2023)   [employer] of Occupational Health - Occupational Stress Questionnaire    Feeling of Stress : To some extent  Social Connections: Socially Isolated (07/25/2023)   Social Connection and Isolation Panel [NHANES]    Frequency of Communication with Friends and Family: Never    Frequency of Social Gatherings with Friends and Family: More than three times a week    Attends Religious Services: Never    Active Member  of Clubs or Organizations: No    Attends Banker Meetings: Never    Marital Status: Never married  Intimate Partner Violence: Not At Risk (07/25/2023)   Humiliation, Afraid, Rape, and Kick questionnaire    Fear of Current or Ex-Partner: No    Emotionally Abused: No    Physically Abused: No    Sexually Abused: No    Review of Systems  All other systems reviewed and are negative.       Objective    BP 114/76 (BP Location: Right Arm, Patient Position: Sitting, Cuff Size: Normal)   Pulse 65   Temp 98.1 F (36.7 C) (Oral)   Resp 16   Wt 153 lb 6.4 oz (69.6 kg)   LMP  (LMP Unknown)   SpO2 98%   BMI 27.17 kg/m   Physical Exam Vitals and nursing note reviewed.  Constitutional:      General: She is not in  acute distress. Cardiovascular:     Rate and Rhythm: Normal rate and regular rhythm.  Pulmonary:     Effort: Pulmonary effort is normal.     Breath sounds: Normal breath sounds.  Abdominal:     Palpations: Abdomen is soft.     Tenderness: There is no abdominal tenderness.  Neurological:     General: No focal deficit present.     Mental Status: She is alert and oriented to person, place, and time.  Psychiatric:        Mood and Affect: Mood is depressed. Affect is tearful.         Assessment & Plan:  1. Anxiety and depression Zoloft prescribed.   2. PCOS (polycystic ovarian syndrome) Metformin prescribed  3. Encounter to establish care     Return in about 4 weeks (around 08/22/2023) for follow up.   Tommie Raymond, MD

## 2023-08-22 ENCOUNTER — Ambulatory Visit: Payer: Medicaid Other | Admitting: Family Medicine

## 2023-08-24 ENCOUNTER — Ambulatory Visit: Payer: Medicaid Other | Admitting: Family Medicine

## 2023-09-16 ENCOUNTER — Ambulatory Visit
Admission: RE | Admit: 2023-09-16 | Discharge: 2023-09-16 | Disposition: A | Discharge status: Home / Self Care | Payer: Self-pay | Source: Ambulatory Visit | Attending: Internal Medicine | Admitting: Internal Medicine

## 2023-09-16 VITALS — BP 116/80 | HR 71 | Temp 98.2°F | Resp 16

## 2023-09-16 DIAGNOSIS — Z113 Encounter for screening for infections with a predominantly sexual mode of transmission: Secondary | ICD-10-CM | POA: Diagnosis not present

## 2023-09-16 DIAGNOSIS — Z202 Contact with and (suspected) exposure to infections with a predominantly sexual mode of transmission: Secondary | ICD-10-CM | POA: Insufficient documentation

## 2023-09-16 DIAGNOSIS — Z9189 Other specified personal risk factors, not elsewhere classified: Secondary | ICD-10-CM | POA: Insufficient documentation

## 2023-09-16 NOTE — ED Provider Notes (Signed)
[name]    CSN: [id number] Arrival date & time: 09/16/23  1113      History   Chief Complaint Chief Complaint  Patient presents with   Exposure to STD    Entered by patient    HPI Paige [name] is a 33 y.o. female.   Paige [name] is a 33 y.o. female presenting for chief complaint of STD testing. Patient's female partner has been sexually active with other people and therefore she may have been exposed to STD unknowingly. Requesting HIV and syphilis testing today as well.  Denies symptoms currently.  No vaginal discharge, itching, or odor.  No urinary symptoms, nausea, vomiting, diarrhea, abdominal pain, or recent fevers/chills.  No recent antibiotic or steroid use.  Last menstrual cycle was on August 25, 2023.  She is unsure of pregnancy status as she is sexually active unprotected with female partner and does not use contraception.   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 Authorizing Provider  fluconazole (DIFLUCAN) 150 MG tablet Take 1 tablet for antibiotic associated yeast infection.  You may refill this at the end of your antibiotic course if you continue to have vaginal itching. Patient not taking: Reported on 09/16/2023 07/03/23   Wallis Bamberg, PA-C  metFORMIN (GLUCOPHAGE-XR) 500 MG 24 hr tablet Take 1 tablet (500 mg  total) by mouth daily with breakfast. Patient not taking: Reported on 09/16/2023 07/25/23   Georganna Skeans, MD  metroNIDAZOLE (FLAGYL) 500 MG tablet Take 1 tablet (500 mg total) by mouth 2 (two) times daily. Patient not taking: Reported on 09/16/2023 07/04/23   Wallis Bamberg, PA-C  sertraline (ZOLOFT) 25 MG tablet Take 1 tablet (25 mg total) by mouth daily. Patient not taking: Reported on 09/16/2023 07/25/23   Georganna Skeans, MD    Family History Family History  Problem Relation Age of Onset   Colon cancer Maternal Grandmother        not sure of age    Social History Social History   Tobacco Use   Smoking status: Never   Smokeless tobacco: Never  Vaping Use   Vaping status: Never Used  Substance Use Topics   Alcohol use: Not Currently    Comment: occasional   Drug use: Not Currently    Types: Marijuana    Comment: occasional     Allergies   Cherry and Latex   Review of Systems Review of Systems Per HPI  Physical Exam Triage Vital Signs ED Triage Vitals  Encounter Vitals Group     BP 09/16/23 1116 116/80  Systolic BP Percentile --      Diastolic BP Percentile --      Pulse Rate 09/16/23 1116 71     Resp 09/16/23 1116 16     Temp 09/16/23 1116 98.2 F (36.8 C)     Temp Source 09/16/23 1116 Oral     SpO2 09/16/23 1116 98 %     Weight --      Height --      Head Circumference --      Peak Flow --      Pain Score 09/16/23 1119 0     Pain Loc --      Pain Education --      Exclude from Growth Chart --    No data found.  Updated Vital Signs BP 116/80 (BP Location: Right Arm)   Pulse 71   Temp 98.2 F (36.8 C) (Oral)   Resp 16   LMP 08/25/2023 (Exact Date)   SpO2 98%   Visual Acuity Right Eye Distance:   Left Eye Distance:   Bilateral Distance:    Right Eye Near:   Left Eye Near:    Bilateral Near:     Physical Exam Vitals and nursing note reviewed.  Constitutional:      Appearance: She is not ill-appearing or toxic-appearing.  HENT:      Head: Normocephalic and atraumatic.     Right Ear: Hearing and external ear normal.     Left Ear: Hearing and external ear normal.     Nose: Nose normal.     Mouth/Throat:     Lips: Pink.  Eyes:     General: Lids are normal. Vision grossly intact. Gaze aligned appropriately.     Extraocular Movements: Extraocular movements intact.     Conjunctiva/sclera: Conjunctivae normal.  Pulmonary:     Effort: Pulmonary effort is normal.  Musculoskeletal:     Cervical back: Neck supple.  Skin:    General: Skin is warm and dry.     Capillary Refill: Capillary refill takes less than 2 seconds.     Findings: No rash.  Neurological:     General: No focal deficit present.     Mental Status: She is alert and oriented to person, place, and time. Mental status is at baseline.     Cranial Nerves: No dysarthria or facial asymmetry.  Psychiatric:        Mood and Affect: Mood normal.        Speech: Speech normal.        Behavior: Behavior normal.        Thought Content: Thought content normal.        Judgment: Judgment normal.      UC Treatments / Results  Labs (all labs ordered are listed, but only abnormal results are displayed) Labs Reviewed  HIV ANTIBODY (ROUTINE TESTING W REFLEX)  RPR  POCT URINE PREGNANCY  CERVICOVAGINAL ANCILLARY ONLY    EKG   Radiology No results found.  Procedures Procedures (including critical care time)  Medications Ordered in UC Medications - No data to display  Initial Impression / Assessment and Plan / UC Course  I have reviewed the triage vital signs and the nursing notes.  Pertinent labs & imaging results that were available during my care of the patient were reviewed by me and considered in my medical decision making (see chart for details).   1.  At risk for sexually transmitted disease due to unprotected sex STI labs pending, will notify patient of positive  results and treat accordingly per protocol when labs result.  Patient would like HIV and  syphilis testing today.   Patient to avoid sexual intercourse until screening testing comes back.   Education provided regarding safe sexual practices and patient encouraged to use protection to prevent spread of STIs.    Patient attempted to void for urine pregnancy test but was unable to in clinic.  Advised to go home and take a test just in case she is positive and needs an antibiotic to make sure we provide treatment that is safe for pregnancy.   Counseled patient on potential for adverse effects with medications prescribed/recommended today, strict ER and return-to-clinic precautions discussed, patient verbalized understanding.    Final Clinical Impressions(s) / UC Diagnoses   Final diagnoses:  At risk for sexually transmitted disease due to unprotected sex     Discharge Instructions      STD testing pending, this will take 2-3 days to result. We will only call you if your testing is positive for any infection(s) and we will provide treatment.  Avoid sexual intercourse until your STD results come back.  If any of your STD results are positive, you will need to avoid sexual intercourse for 7 days while you are being treated to prevent spread of STD.  Condom use is the best way to prevent spread of STDs. Notify partner(s) of any positive results.  Return to urgent care as needed.      ED Prescriptions   None    PDMP not reviewed this encounter.   Carlisle Beers, [place] 09/16/23 1206

## 2023-09-16 NOTE — ED Triage Notes (Signed)
Pt presents for STD testing. Denies any symptoms.

## 2023-09-16 NOTE — ED Notes (Signed)
Urine Pregnancy not complete due to patient needing to leave and not being able to urinate at this time. Accidentally clicked that urine preg was complete and cannot discontinue order

## 2023-09-16 NOTE — Discharge Instructions (Signed)

## 2023-09-18 LAB — CERVICOVAGINAL ANCILLARY ONLY
Chlamydia: NEGATIVE
Comment: NEGATIVE
Comment: NEGATIVE
Comment: NORMAL
Neisseria Gonorrhea: NEGATIVE
Trichomonas: NEGATIVE

## 2023-09-19 LAB — RPR: RPR Ser Ql: NONREACTIVE

## 2023-09-19 LAB — HIV ANTIBODY (ROUTINE TESTING W REFLEX): HIV Screen 4th Generation wRfx: NONREACTIVE

## 2023-10-20 ENCOUNTER — Encounter (HOSPITAL_COMMUNITY): Payer: Self-pay

## 2023-10-20 ENCOUNTER — Ambulatory Visit (HOSPITAL_COMMUNITY)
Admission: RE | Admit: 2023-10-20 | Discharge: 2023-10-20 | Disposition: A | Discharge status: Home / Self Care | Payer: Medicaid Other | Source: Ambulatory Visit | Attending: Emergency Medicine | Admitting: Emergency Medicine

## 2023-10-20 VITALS — BP 119/88 | HR 79 | Temp 98.9°F | Resp 18

## 2023-10-20 DIAGNOSIS — J4521 Mild intermittent asthma with (acute) exacerbation: Secondary | ICD-10-CM | POA: Diagnosis not present

## 2023-10-20 DIAGNOSIS — J309 Allergic rhinitis, unspecified: Secondary | ICD-10-CM

## 2023-10-20 MED ORDER — METHYLPREDNISOLONE ACETATE 80 MG/ML IJ SUSP
60.0000 mg | Freq: Once | INTRAMUSCULAR | Status: AC
  Administered 2023-10-20: 60 mg via INTRAMUSCULAR

## 2023-10-20 MED ORDER — LEVOCETIRIZINE DIHYDROCHLORIDE 5 MG PO TABS: 5.0000 mg | ORAL_TABLET | Freq: Every evening | ORAL | 2 refills | Status: DC

## 2023-10-20 MED ORDER — FLUTICASONE PROPIONATE 50 MCG/ACT NA SUSP
1.0000 | Freq: Every day | NASAL | 2 refills | Status: DC
Start: 2023-10-20 — End: 2024-07-21

## 2023-10-20 MED ORDER — METHYLPREDNISOLONE 4 MG PO TBPK: ORAL_TABLET | ORAL | 0 refills | Status: DC

## 2023-10-20 MED ORDER — METHYLPREDNISOLONE ACETATE 80 MG/ML IJ SUSP
INTRAMUSCULAR | Status: AC
  Filled 2023-10-20: qty 1

## 2023-10-20 NOTE — ED Provider Notes (Signed)
 [facility]    CSN: [id number] Arrival date & time: 10/20/23  1541    HISTORY   Chief Complaint  Patient presents with   Appointment   Cough   HPI Paige [name] is a pleasant, 33 y.o. female who presents to urgent care today. Patient complains of nasal congestion, clear rhinorrhea, scratchy throat, subjective fever, pain in chest when coughing.  Patient is also complaining of cough productive of green mucus.  Patient states symptoms began 3 days ago but the nasal congestion and rhinorrhea have been going on a lot longer.  Patient reports a history of allergies and asthma, does not currently take any allergy medications and does not have an albuterol inhaler.  Patient has normal vital signs on arrival today.  The history is provided by the patient.   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 Authorizing Provider  fluconazole (DIFLUCAN) 150 MG tablet Take 1 tablet for antibiotic associated yeast infection.  You may refill this at the end of your antibiotic course if you continue to have vaginal itching. Patient not taking: Reported on 09/16/2023 07/03/23   Wallis Bamberg, PA-C  metFORMIN (GLUCOPHAGE-XR) 500 MG 24 hr tablet Take 1 tablet (500 mg total) by mouth daily with breakfast. Patient not taking: Reported on 09/16/2023 07/25/23   Georganna Skeans, MD   metroNIDAZOLE (FLAGYL) 500 MG tablet Take 1 tablet (500 mg total) by mouth 2 (two) times daily. Patient not taking: Reported on 09/16/2023 07/04/23   Wallis Bamberg, PA-C  sertraline (ZOLOFT) 25 MG tablet Take 1 tablet (25 mg total) by mouth daily. Patient not taking: Reported on 09/16/2023 07/25/23   Georganna Skeans, MD    Family History Family History  Problem Relation Age of Onset   Colon cancer Maternal Grandmother        not sure of age   Social History Social History   Tobacco Use   Smoking status: Never   Smokeless tobacco: Never  Vaping Use   Vaping status: Never Used  Substance Use Topics   Alcohol use: Not Currently    Comment: occasional   Drug use: Not Currently    Types: Marijuana    Comment: occasional   Allergies   Cherry and Latex  Review of Systems Review of Systems Pertinent findings revealed after performing a 14 point review of systems has been noted in the history of present illness.  Physical Exam Vital Signs BP 119/88 (BP Location: Right Arm)   Pulse 79   Temp 98.9 F (37.2 C) (Oral)   Resp 18   LMP 09/22/2023 (Exact Date)   SpO2 97%  No data found.  Physical Exam Vitals and nursing note reviewed.  Constitutional:      General: She is not in acute distress.    Appearance: Normal appearance. She is not ill-appearing.     Interventions: She is not intubated. HENT:     Head: Normocephalic and atraumatic.     Salivary Glands: Right salivary gland is not diffusely enlarged or tender. Left salivary gland is not diffusely enlarged or tender.     Right Ear: Ear canal and external ear normal. No drainage. A middle ear effusion is present. There is no impacted cerumen. Tympanic membrane is bulging. Tympanic membrane is not injected or erythematous.     Left Ear: Ear canal and external ear normal. No drainage. A middle ear effusion is present. There is no impacted cerumen. Tympanic membrane is bulging. Tympanic membrane is not injected or erythematous.      Ears:     Comments: Bilateral EACs normal, both TMs bulging with clear fluid    Nose: Rhinorrhea present. No nasal deformity, septal deviation, signs of injury, nasal tenderness, mucosal edema or congestion. Rhinorrhea is clear.     Right Nostril: Occlusion present. No foreign body, epistaxis or septal hematoma.     Left Nostril: Occlusion present. No foreign body, epistaxis or septal hematoma.     Right Turbinates: Enlarged, swollen and pale.     Left Turbinates: Enlarged, swollen and pale.     Right Sinus: No maxillary sinus tenderness or frontal sinus tenderness.     Left Sinus: No maxillary sinus tenderness or frontal sinus tenderness.     Mouth/Throat:     Lips: Pink. No lesions.     Mouth: Mucous membranes are moist. No oral lesions.     Pharynx: Oropharynx is clear. Uvula midline. No posterior oropharyngeal erythema or uvula swelling.     Tonsils: No tonsillar exudate. 0 on the right. 0 on the left.     Comments: Postnasal drip Eyes:     General: Lids are normal.        Right eye: No discharge.        Left eye: No discharge.     Extraocular Movements: Extraocular movements intact.     Conjunctiva/sclera: Conjunctivae normal.     Right eye: Right conjunctiva is not injected.     Left eye: Left conjunctiva is not injected.  Neck:     Trachea: Trachea and phonation normal.  Cardiovascular:     Rate and Rhythm: Normal rate and regular rhythm.     Pulses: Normal pulses.     Heart sounds: Normal heart sounds. No murmur heard.    No friction rub. No gallop.  Pulmonary:     Effort: Pulmonary effort is normal. No tachypnea, bradypnea, accessory muscle usage, prolonged expiration, respiratory distress or retractions. She is not intubated.     Breath sounds: No stridor, decreased air movement or transmitted upper airway sounds. Examination of the right-upper field reveals decreased breath sounds. Examination of the left-upper field reveals decreased breath sounds. Examination of the  right-middle field reveals decreased breath sounds. Examination of the left-middle field reveals decreased breath sounds. Examination of the right-lower field reveals decreased breath sounds. Examination of the left-lower field reveals decreased breath sounds. Decreased breath sounds present. No wheezing, rhonchi or rales.  Chest:     Chest wall: No tenderness.  Musculoskeletal:        General: Normal range of motion.     Cervical back: Normal range of motion and neck supple.  Normal range of motion.  Lymphadenopathy:     Cervical: No cervical adenopathy.  Skin:    General: Skin is warm and dry.     Findings: No erythema or rash.  Neurological:     General: No focal deficit present.     Mental Status: She is alert and oriented to person, place, and time.  Psychiatric:        Mood and Affect: Mood normal.        Behavior: Behavior normal.     Visual Acuity Right Eye Distance:   Left Eye Distance:   Bilateral Distance:    Right Eye Near:   Left Eye Near:    Bilateral Near:     UC Couse / Diagnostics / Procedures:     Radiology No results found.  Procedures Procedures (including critical care time) EKG  Pending results:  Labs Reviewed - No data to display  Medications Ordered in UC: Medications  methylPREDNISolone acetate (DEPO-MEDROL) injection 60 mg (60 mg Intramuscular Given 10/20/23 1711)    UC Diagnoses / Final Clinical Impressions(s)   I have reviewed the triage vital signs and the nursing notes.  Pertinent labs & imaging results that were available during my care of the patient were reviewed by me and considered in my medical decision making (see chart for details).    Final diagnoses:  Allergic rhinitis, unspecified seasonality, unspecified trigger  Mild intermittent extrinsic asthma with acute exacerbation   Physical exam findings concerning for exacerbation of known upper respiratory allergies and asthma.  Patient provided with a Depo-Medrol injection  during her visit and a tapering dose of methylprednisolone for relief of mild asthma exacerbation.  For relief of allergy symptoms, recommend Xyzal and Flonase.  Physical exam findings not concerning for pneumonia at this time.  Conservative care recommended.  Return precautions advised.  Please see discharge instructions below for details of plan of care as provided to patient. ED Prescriptions     Medication Sig Dispense Auth. Provider   methylPREDNISolone (MEDROL DOSEPAK) 4 MG TBPK tablet Take 24 mg on day 1, 20 mg on day 2, 16 mg on day 3, 12 mg on day 4, 8 mg on day 5, 4 mg on day 6.  Take all tablets in each row at once, do not spread tablets out throughout the day. 21 tablet Theadora Rama Scales, PA-C   levocetirizine (XYZAL) 5 MG tablet Take 1 tablet (5 mg total) by mouth every evening. 30 tablet Theadora Rama Scales, PA-C   fluticasone (FLONASE) 50 MCG/ACT nasal spray Place 1 spray into both nostrils daily. Begin by using 2 sprays in each nare daily for 3 to 5 days, then decrease to 1 spray in each nare daily. 15.8 mL Theadora Rama Scales, PA-C      PDMP not reviewed this encounter.  Pending results:  Labs Reviewed - No data to display    Discharge Instructions      Your symptoms and my physical exam findings are concerning for exacerbation of your underlying allergies.     Please read below to learn more about the medications, dosages and frequencies that I recommend to help alleviate your symptoms and to get you feeling better soon: Depo-Medrol IM (methylprednisolone):  To quickly address your significant respiratory inflammation, you were provided with an injection of Solu-Medrol in the office today.  You should continue to feel the full benefit of the steroid for the next 8 to 12 hours.  Medrol Dosepak (methylprednisolone): This is a steroid that will  significantly calm your upper and lower airways.  Please take one row of tablets daily with your breakfast meal starting  tomorrow morning until the prescription is complete.      Xyzal (levocetirizine): This is an excellent second-generation antihistamine that helps to reduce respiratory inflammatory response to environmental allergens.  In some patients, this medication can cause daytime sleepiness so I recommend that you take 1 tablet daily at bedtime.     Flonase (fluticasone): This is a steroid nasal spray that used once daily, 1 spray in each nare.  This works best when used on a daily basis. This medication does not work well if it is only used when you think you need it.  After 3 to 5 days of use, you will notice significant reduction of the inflammation and mucus production that is currently being caused by exposure to allergens, whether seasonal or environmental.  The most common side effect of this medication is nosebleeds.  If you experience a nosebleed, please discontinue use for 1 week, then feel free to resume.  If you find that your insurance will not pay for this medication, please consider a different nasal steroids such as Nasonex (mometasone), or Nasacort (triamcinolone).   If symptoms have not meaningfully improved in the next 5 to 7 days, please return for repeat evaluation or follow-up with your regular provider.  If symptoms have worsened in the next 3 to 5 days, please return for repeat evaluation or follow-up with your regular provider.    Thank you for visiting urgent care today.  We appreciate the opportunity to participate in your care.       Disposition Upon Discharge:  Condition: stable for discharge home  Patient presented with an acute illness with associated systemic symptoms and significant discomfort requiring urgent management. In my opinion, this is a condition that a prudent lay person (someone who possesses an average knowledge of health and medicine) may potentially expect to result in complications if not addressed urgently such as respiratory distress, impairment of bodily  function or dysfunction of bodily organs.   Routine symptom specific, illness specific and/or disease specific instructions were discussed with the patient and/or caregiver at length.   As such, the patient has been evaluated and assessed, work-up was performed and treatment was provided in alignment with urgent care protocols and evidence based medicine.  Patient/parent/caregiver has been advised that the patient may require follow up for further testing and treatment if the symptoms continue in spite of treatment, as clinically indicated and appropriate.  Patient/parent/caregiver has been advised to return to the [facility] or PCP if no better; to PCP or the Emergency Department if new signs and symptoms develop, or if the current signs or symptoms continue to change or worsen for further workup, evaluation and treatment as clinically indicated and appropriate  The patient will follow up with their current PCP if and as advised. If the patient does not currently have a PCP we will assist them in obtaining one.   The patient may need specialty follow up if the symptoms continue, in spite of conservative treatment and management, for further workup, evaluation, consultation and treatment as clinically indicated and appropriate.  Patient/parent/caregiver verbalized understanding and agreement of plan as discussed.  All questions were addressed during visit.  Please see discharge instructions below for further details of plan.  This office note has been dictated using Teaching laboratory technician.  Unfortunately, this method of dictation can sometimes lead to typographical or grammatical errors.  I  apologize for your inconvenience in advance if this occurs.  Please do not hesitate to reach out to me if clarification is needed.      Theadora Rama Scales, [place] 10/20/23 2105

## 2023-10-20 NOTE — ED Triage Notes (Signed)
 Chief Complaint: Nasal congestion, sore throat, runny nose, fever, and chest pain "like someone kicked me in it." Patient having a productive cough with green mucus.   Sick exposure: No  Onset: 3 days ago   Prescriptions or OTC medications tried: Yes- Cough and cold meds    with no relief  New foods, medications, or products: No  Recent Travel: No

## 2023-10-20 NOTE — Discharge Instructions (Signed)
 Your symptoms and my physical exam findings are concerning for exacerbation of your underlying allergies.     Please read below to learn more about the medications, dosages and frequencies that I recommend to help alleviate your symptoms and to get you feeling better soon: Depo-Medrol IM (methylprednisolone):  To quickly address your significant respiratory inflammation, you were provided with an injection of Solu-Medrol in the office today.  You should continue to feel the full benefit of the steroid for the next 8 to 12 hours.  Medrol Dosepak (methylprednisolone): This is a steroid that will significantly calm your upper and lower airways.  Please take one row of tablets daily with your breakfast meal starting tomorrow morning until the prescription is complete.      Xyzal (levocetirizine): This is an excellent second-generation antihistamine that helps to reduce respiratory inflammatory response to environmental allergens.  In some patients, this medication can cause daytime sleepiness so I recommend that you take 1 tablet daily at bedtime.     Flonase (fluticasone): This is a steroid nasal spray that used once daily, 1 spray in each nare.  This works best when used on a daily basis. This medication does not work well if it is only used when you think you need it.  After 3 to 5 days of use, you will notice significant reduction of the inflammation and mucus production that is currently being caused by exposure to allergens, whether seasonal or environmental.  The most common side effect of this medication is nosebleeds.  If you experience a nosebleed, please discontinue use for 1 week, then feel free to resume.  If you find that your insurance will not pay for this medication, please consider a different nasal steroids such as Nasonex (mometasone), or Nasacort (triamcinolone).   If symptoms have not meaningfully improved in the next 5 to 7 days, please return for repeat evaluation or follow-up with your  regular provider.  If symptoms have worsened in the next 3 to 5 days, please return for repeat evaluation or follow-up with your regular provider.    Thank you for visiting urgent care today.  We appreciate the opportunity to participate in your care.

## 2023-10-30 ENCOUNTER — Ambulatory Visit: Payer: Self-pay

## 2023-10-31 ENCOUNTER — Ambulatory Visit
Admission: RE | Admit: 2023-10-31 | Discharge: 2023-10-31 | Disposition: A | Discharge status: Home / Self Care | Payer: Self-pay | Source: Ambulatory Visit | Attending: Internal Medicine | Admitting: Internal Medicine

## 2023-10-31 ENCOUNTER — Ambulatory Visit: Payer: Self-pay

## 2023-10-31 ENCOUNTER — Other Ambulatory Visit: Payer: Self-pay

## 2023-10-31 VITALS — BP 117/76 | HR 62 | Temp 98.2°F | Resp 16

## 2023-10-31 DIAGNOSIS — M542 Cervicalgia: Secondary | ICD-10-CM

## 2023-10-31 DIAGNOSIS — M5489 Other dorsalgia: Secondary | ICD-10-CM

## 2023-10-31 MED ORDER — CYCLOBENZAPRINE HCL 5 MG PO TABS: 5.0000 mg | ORAL_TABLET | Freq: Two times a day (BID) | ORAL | 0 refills | Status: DC | PRN
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
## 2023-10-31 NOTE — Discharge Instructions (Signed)
 I have prescribed you a muscle relaxer to take as needed.  Please be advised it can make you drowsy.  Follow-up with sports medicine if symptoms persist or worsen.

## 2023-11-28 ENCOUNTER — Ambulatory Visit: Payer: Self-pay

## 2023-11-29 ENCOUNTER — Ambulatory Visit (HOSPITAL_COMMUNITY)
Admission: EM | Admit: 2023-11-29 | Discharge: 2023-11-29 | Disposition: A | Discharge status: Home / Self Care | Attending: Emergency Medicine | Admitting: Emergency Medicine

## 2023-11-29 ENCOUNTER — Encounter (HOSPITAL_COMMUNITY): Payer: Self-pay

## 2023-11-29 DIAGNOSIS — Z3202 Encounter for pregnancy test, result negative: Secondary | ICD-10-CM | POA: Diagnosis not present

## 2023-11-29 DIAGNOSIS — Z113 Encounter for screening for infections with a predominantly sexual mode of transmission: Secondary | ICD-10-CM

## 2023-11-29 DIAGNOSIS — N898 Other specified noninflammatory disorders of vagina: Secondary | ICD-10-CM | POA: Diagnosis present

## 2023-11-29 LAB — HIV ANTIBODY (ROUTINE TESTING W REFLEX): HIV Screen 4th Generation wRfx: NONREACTIVE

## 2023-11-29 LAB — POCT URINE PREGNANCY: Preg Test, Ur: NEGATIVE

## 2023-11-29 NOTE — Discharge Instructions (Signed)
 Your pregnancy test was negative today.  As discussed your period can be irregular due to your history of PCOS.  I recommend monitoring your menstrual cycles and following up with OB/GYN if they continue to be irregular.  Your swab and blood results will come back over the next few days and someone will call if results are positive and require treatment.  Return here as needed.

## 2023-11-29 NOTE — ED Triage Notes (Signed)
 Pt c/o white thick vaginal discharge x3 days. States took diflucan x1 with relief but needs more. Requesting STD testing and pregnancy test.

## 2023-11-29 NOTE — ED Provider Notes (Signed)
 [facility]    CSN: [id number] Arrival date & time: 11/29/23  0940      History   Chief Complaint Chief Complaint  Patient presents with   Vaginal Discharge    HPI Paige [name] is a 33 y.o. female.   Patient presents with clumpy, white vaginal discharge  3 days. Patient states that she took a diflucan yesterday with relief of clumpy, white discharge, but states that she still notices a vaginal odor and wanted to be sure there is no other infection present.  Patient is also requesting STD testing and a pregnancy test. LMP 10/23/2023.   Denies abdominal pain, abnormal vaginal bleeding, urinary symptoms, and fever. Denies any known exposures to STDs.  History of PCOS.   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 Authorizing Provider  cyclobenzaprine (FLEXERIL) 5 MG tablet Take 1 tablet (5 mg total) by mouth 2 (two) times daily as needed for muscle spasms. 10/31/23   Gustavus Bryant, FNP  fluticasone (FLONASE) 50 MCG/ACT nasal spray Place 1 spray into both nostrils daily. Begin by using 2 sprays in each nare daily for 3 to 5 days, then decrease to 1 spray in each nare daily. 10/20/23   Theadora Rama Scales, PA-C  levocetirizine (XYZAL) 5 MG tablet Take 1 tablet (5 mg total) by mouth every evening. 10/20/23 01/18/24  Theadora Rama  Scales, PA-C    Family History Family History  Problem Relation Age of Onset   Colon cancer Maternal Grandmother        not sure of age    Social History Social History   Tobacco Use   Smoking status: Never   Smokeless tobacco: Never  Vaping Use   Vaping status: Never Used  Substance Use Topics   Alcohol use: Not Currently    Comment: occasional   Drug use: Not Currently    Types: Marijuana    Comment: occasional     Allergies   Cherry and Latex   Review of Systems Review of Systems  Genitourinary:  Positive for vaginal discharge.   Per HPI  Physical Exam Triage Vital Signs ED Triage Vitals  Encounter Vitals Group     BP 11/29/23 1026 116/76     Systolic BP Percentile --      Diastolic BP Percentile --      Pulse Rate 11/29/23 1026 67     Resp 11/29/23 1026 18     Temp 11/29/23 1026 98.7 F (37.1 C)     Temp Source 11/29/23 1026 Oral     SpO2  11/29/23 1026 97 %     Weight --      Height --      Head Circumference --      Peak Flow --      Pain Score 11/29/23 1027 0     Pain Loc --      Pain Education --      Exclude from Growth Chart --    No data found.  Updated Vital Signs BP 116/76 (BP Location: Right Arm)   Pulse 67   Temp 98.7 F (37.1 C) (Oral)   Resp 18   LMP 10/23/2023 (Exact Date)   SpO2 97%   Visual Acuity Right Eye Distance:   Left Eye Distance:   Bilateral Distance:    Right Eye Near:   Left Eye Near:    Bilateral Near:     Physical Exam Vitals and nursing note reviewed.  Constitutional:      General: She is awake. She is not in acute distress.    Appearance: Normal appearance. She is well-developed and well-groomed. She is not ill-appearing.  Genitourinary:    Comments: Exam deferred Skin:    General: Skin is warm and dry.  Neurological:     Mental Status: She is alert.  Psychiatric:        Behavior: Behavior is cooperative.      UC Treatments / Results  Labs (all labs ordered are listed, but only abnormal  results are displayed) Labs Reviewed  POCT URINE PREGNANCY - Normal  HIV ANTIBODY (ROUTINE TESTING W REFLEX)  RPR  CERVICOVAGINAL ANCILLARY ONLY    EKG   Radiology No results found.  Procedures Procedures (including critical care time)  Medications Ordered in UC Medications - No data to display  Initial Impression / Assessment and Plan / UC Course  I have reviewed the triage vital signs and the nursing notes.  Pertinent labs & imaging results that were available during my care of the patient were reviewed by me and considered in my medical decision making (see chart for details).     Urine pregnancy negative.  GU exam deferred.  Patient perform self swab for STD/STI.  HIV and syphilis testing ordered.  Discussed follow-up and return precautions. Final Clinical Impressions(s) / UC Diagnoses   Final diagnoses:  Vaginal discharge  Screening for STD (sexually transmitted disease)  Pregnancy examination or test, negative result     Discharge Instructions      Your pregnancy test was negative today.  As discussed your period can be irregular due to your history of PCOS.  I recommend monitoring your menstrual cycles and following up with OB/GYN if they continue to be irregular.  Your swab and blood results will come back over the next few days and someone will call if results are positive and require treatment.  Return here as needed.     ED Prescriptions   None    PDMP not reviewed this encounter.   Wynonia Lawman A, NP 11/29/23 1059

## 2023-11-30 ENCOUNTER — Telehealth (HOSPITAL_COMMUNITY): Payer: Self-pay

## 2023-11-30 LAB — CERVICOVAGINAL ANCILLARY ONLY
Bacterial Vaginitis (gardnerella): POSITIVE — AB
Candida Glabrata: NEGATIVE
Candida Vaginitis: POSITIVE — AB
Chlamydia: NEGATIVE
Comment: NEGATIVE
Comment: NEGATIVE
Comment: NEGATIVE
Comment: NEGATIVE
Comment: NEGATIVE
Comment: NORMAL
Neisseria Gonorrhea: NEGATIVE
Trichomonas: POSITIVE — AB

## 2023-11-30 LAB — RPR: RPR Ser Ql: NONREACTIVE

## 2023-11-30 MED ORDER — METRONIDAZOLE 500 MG PO TABS: 500.0000 mg | ORAL_TABLET | Freq: Two times a day (BID) | ORAL | 0 refills | Status: AC

## 2023-11-30 MED ORDER — FLUCONAZOLE 150 MG PO TABS: 150.0000 mg | ORAL_TABLET | Freq: Once | ORAL | 0 refills | Status: AC

## 2023-11-30 NOTE — Telephone Encounter (Signed)
 Per protocol, pt requires tx with metronidazole and Diflucan. Reviewed with patient, verified pharmacy, prescription sent.

## 2023-12-14 ENCOUNTER — Ambulatory Visit (HOSPITAL_COMMUNITY): Payer: Self-pay

## 2023-12-15 ENCOUNTER — Ambulatory Visit (HOSPITAL_COMMUNITY)
Admission: RE | Admit: 2023-12-15 | Discharge: 2023-12-15 | Disposition: A | Discharge status: Home / Self Care | Source: Ambulatory Visit | Attending: Family Medicine | Admitting: Family Medicine

## 2023-12-15 ENCOUNTER — Encounter (HOSPITAL_COMMUNITY): Payer: Self-pay

## 2023-12-15 VITALS — BP 110/79 | HR 78 | Temp 98.1°F | Resp 18 | Ht 63.0 in | Wt 153.0 lb

## 2023-12-15 DIAGNOSIS — Z09 Encounter for follow-up examination after completed treatment for conditions other than malignant neoplasm: Secondary | ICD-10-CM | POA: Diagnosis not present

## 2023-12-15 DIAGNOSIS — Z711 Person with feared health complaint in whom no diagnosis is made: Secondary | ICD-10-CM

## 2023-12-15 NOTE — ED Triage Notes (Signed)
 Patient is wanting a follow up cyto swab to make sure that her BV, yeast and trich has cleared up.  Patient has completed treatment x 1 week.  Not having any sx's.

## 2023-12-15 NOTE — ED Provider Notes (Signed)
 [facility]    CSN: [id number] Arrival date & time: 12/15/23  1742      History   Chief Complaint Chief Complaint  Patient presents with   Follow-up    Entered by patient    HPI Paige [name] is a 33 y.o. female.  Initially presented for retest to make sure trichomonas, BV and yeast were gone.  She tested positive on 4/2.  Completed all treatments.  Has abstained from intercourse.  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 Authorizing Provider  fluticasone  (FLONASE ) 50 MCG/ACT nasal spray Place 1 spray into both nostrils daily. Begin by using 2 sprays in each nare daily for 3 to 5 days, then decrease to 1 spray in each nare daily. 10/20/23  Yes Eloise Hake Scales, PA-C  levocetirizine (XYZAL ) 5 MG tablet Take 1 tablet (5 mg total) by mouth every evening. 10/20/23 01/18/24 Yes Eloise Hake Scales, PA-C  cyclobenzaprine  (FLEXERIL ) 5 MG tablet Take 1 tablet (5 mg total) by mouth 2 (two) times daily as needed for muscle spasms. 10/31/23   Dodson Freestone, FNP    Family History Family History  Problem Relation Age of Onset   Colon cancer Maternal Grandmother        not sure of age    Social History Social History   Tobacco Use   Smoking status: Never   Smokeless tobacco: Never  Vaping Use   Vaping status: Never  Used  Substance Use Topics   Alcohol use: Not Currently    Comment: occasional   Drug use: Not Currently    Types: Marijuana    Comment: occasional     Allergies   Cherry and Latex   Review of Systems Review of Systems  Physical Exam Triage Vital Signs ED Triage Vitals  Encounter Vitals Group     BP 12/15/23 1758 110/79     Systolic BP Percentile --      Diastolic BP Percentile --      Pulse Rate 12/15/23 1758 78     Resp 12/15/23 1758 18     Temp 12/15/23 1758 98.1 F (36.7 C)     Temp Source 12/15/23 1758 Oral     SpO2 12/15/23 1758 96 %     Weight 12/15/23 1800 153 lb (69.4 kg)     Height 12/15/23 1800 5\' 3"  (1.6 m)     Head Circumference --      Peak Flow --      Pain Score 12/15/23 1800 0     Pain Loc --  Pain Education --      Exclude from Growth Chart --    No data found.  Updated Vital Signs BP 110/79 (BP Location: Right Arm)   Pulse 78   Temp 98.1 F (36.7 C) (Oral)   Resp 18   Ht 5\' 3"  (1.6 m)   Wt 153 lb (69.4 kg)   LMP 11/29/2023 (Exact Date)   SpO2 96%   BMI 27.10 kg/m   Visual Acuity Right Eye Distance:   Left Eye Distance:   Bilateral Distance:    Right Eye Near:   Left Eye Near:    Bilateral Near:     Physical Exam Vitals and nursing note reviewed.  Constitutional:      General: She is not in acute distress.    Appearance: Normal appearance.  Cardiovascular:     Rate and Rhythm: Normal rate and regular rhythm.  Pulmonary:     Effort: Pulmonary effort is normal.  Neurological:     Mental Status: She is alert and oriented to person, place, and time.     UC Treatments / Results  Labs (all labs ordered are listed, but only abnormal results are displayed) Labs Reviewed - No data to display  EKG   Radiology No results found.  Procedures Procedures (including critical care time)  Medications Ordered in UC Medications - No data to display  Initial Impression / Assessment and Plan / UC Course  I have reviewed  the triage vital signs and the nursing notes.  Pertinent labs & imaging results that were available during my care of the patient were reviewed by me and considered in my medical decision making (see chart for details).  Discussion with patient no test of cure is indicated. Advised test can be positive for 6-8 weeks. Since she has finished all her medications and abstained from intercourse, having no symptoms, reassurance is provided the infections were treated. Patient is agreeable and understands. All questions answered  Final Clinical Impressions(s) / UC Diagnoses   Final diagnoses:  None   Discharge Instructions   None    ED Prescriptions   None    PDMP not reviewed this encounter.   Kaity Pitstick, Ivette Marks, [place] 12/15/23 4098

## 2024-01-20 ENCOUNTER — Ambulatory Visit (HOSPITAL_COMMUNITY)
Admission: EM | Admit: 2024-01-20 | Discharge: 2024-01-20 | Discharge status: Against Medical Advice | Attending: Family Medicine | Admitting: Family Medicine

## 2024-01-20 NOTE — ED Notes (Signed)
 No answer from lobby. This RN looked outside and yelled patient's name with no answer.  This RN called patient's phone number in chart with no answer.

## 2024-01-20 NOTE — ED Notes (Signed)
 Pt not in lobby when called, patient access stated that patient went outside

## 2024-02-05 ENCOUNTER — Encounter (HOSPITAL_COMMUNITY): Payer: Self-pay

## 2024-02-05 ENCOUNTER — Ambulatory Visit (HOSPITAL_COMMUNITY)
Admission: RE | Admit: 2024-02-05 | Discharge: 2024-02-05 | Disposition: A | Discharge status: Home / Self Care | Source: Ambulatory Visit | Attending: Emergency Medicine | Admitting: Emergency Medicine

## 2024-02-05 VITALS — BP 112/76 | HR 64 | Temp 98.0°F | Resp 15

## 2024-02-05 DIAGNOSIS — Z113 Encounter for screening for infections with a predominantly sexual mode of transmission: Secondary | ICD-10-CM

## 2024-02-05 DIAGNOSIS — N898 Other specified noninflammatory disorders of vagina: Secondary | ICD-10-CM | POA: Diagnosis not present

## 2024-02-05 LAB — HIV ANTIBODY (ROUTINE TESTING W REFLEX): HIV Screen 4th Generation wRfx: NONREACTIVE

## 2024-02-05 NOTE — Discharge Instructions (Signed)
 You have been re-screened for sexually transmitted infections and results will be available over the next few days. Staff will contact you if anything results as abnormal. A PCP may be able to screen for HSV through blood if indicated.   Return to clinic for new or urgent symptoms.

## 2024-02-05 NOTE — ED Triage Notes (Signed)
 Pt was positive for BV, yeast and Trich.back in April and told to retest in 6 weeks.   Reports that was made aware that a sexual partner has herpes and wants testing. Reports that had bumps in genital area that was 2 weeks ago.

## 2024-02-05 NOTE — ED Provider Notes (Signed)
 [facility]    CSN: [id number] Arrival date & time: 02/05/24  1721      History   Chief Complaint Chief Complaint  Patient presents with   Exposure to STD    Entered by patient    HPI Paige [name] is a 33 y.o. female.   Patient presents to clinic over concerns of STI. She had tested positive for BV, trich and yeast on 4/2. Completed treatment and would like re-testing. Has not had vaginal discharge, pelvic pain or urinary symptoms.   She did have grouped sores in the vaginal area around 2 weeks ago. A female partner she was sexually active with was exposed recently for having HSV-1 and HSV-2. Patient is concerned that maybe instead of razor bumps that she may have had herpes. No current rash or lesions.   The history is provided by the patient and medical records.  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 Authorizing Provider  cyclobenzaprine  (FLEXERIL ) 5 MG tablet Take 1 tablet (5 mg total) by mouth 2 (two) times daily as needed for muscle spasms. 10/31/23   Dodson Freestone, FNP  fluticasone  (FLONASE ) 50 MCG/ACT nasal spray Place 1 spray into both nostrils daily. Begin by using 2 sprays in each nare daily for 3 to 5 days, then decrease to 1 spray in each nare daily. 10/20/23   Eloise Hake Scales, PA-C  levocetirizine (XYZAL )  5 MG tablet Take 1 tablet (5 mg total) by mouth every evening. 10/20/23 01/18/24  Eloise Hake Scales, PA-C    Family History Family History  Problem Relation Age of Onset   Colon cancer Maternal Grandmother        not sure of age    Social History Social History   Tobacco Use   Smoking status: Never   Smokeless tobacco: Never  Vaping Use   Vaping status: Never Used  Substance Use Topics   Alcohol use: Not Currently    Comment: occasional   Drug use: Not Currently    Types: Marijuana    Comment: occasional     Allergies   Cherry and Latex   Review of Systems Review of Systems  Per HPI  Physical Exam Triage Vital Signs ED Triage Vitals [02/05/24 1758]  Encounter Vitals Group     BP 112/76     Systolic BP Percentile      Diastolic BP Percentile      Pulse Rate 64     Resp 15     Temp 98 F (36.7 C)  Temp src      SpO2 97 %     Weight      Height      Head Circumference      Peak Flow      Pain Score 0     Pain Loc      Pain Education      Exclude from Growth Chart    No data found.  Updated Vital Signs BP 112/76 (BP Location: Left Arm)   Pulse 64   Temp 98 F (36.7 C)   Resp 15   LMP 01/29/2024 (Exact Date)   SpO2 97%   Visual Acuity Right Eye Distance:   Left Eye Distance:   Bilateral Distance:    Right Eye Near:   Left Eye Near:    Bilateral Near:     Physical Exam Vitals and nursing note reviewed. Exam conducted with a chaperone present.  Constitutional:      Appearance: Normal appearance.  HENT:     Head: Normocephalic and atraumatic.     Right Ear: External ear normal.     Left Ear: External ear normal.     Nose: Nose normal.     Mouth/Throat:     Mouth: Mucous membranes are moist.  Eyes:     Conjunctiva/sclera: Conjunctivae normal.  Cardiovascular:     Rate and Rhythm: Normal rate.  Pulmonary:     Effort: Pulmonary effort is normal. No respiratory distress.  Genitourinary:    Exam position: Knee-chest position.      Pubic Area: No rash.      Labia:        Right: No lesion.        Left: No lesion.   Neurological:     General: No focal deficit present.     Mental Status: She is alert.  Psychiatric:        Mood and Affect: Mood normal.      UC Treatments / Results  Labs (all labs ordered are listed, but only abnormal results are displayed) Labs Reviewed  RPR  HIV ANTIBODY (ROUTINE TESTING W REFLEX)  CERVICOVAGINAL ANCILLARY ONLY    EKG   Radiology No results found.  Procedures Procedures (including critical care time)  Medications Ordered in UC Medications - No data to display  Initial Impression / Assessment and Plan / UC Course  I have reviewed the triage vital signs and the nursing notes.  Pertinent labs & imaging results that were available during my care of the patient were reviewed by me and considered in my medical decision making (see chart for details).  Vitals and triage reviewed, patient is hemodynamically stable. Chaperone present for GU exam does not reveal any lesions, unable to swab for HSV. Cytology swab, HIV and syphilis screening obtained.   Discussed HSV blood work is not routinely done at [facility] and she can reach out to PCP for this.   POC, f/u care and return precautions given, no questions at this time.      Final Clinical Impressions(s) / UC Diagnoses   Final diagnoses:  Routine screening for STI (sexually transmitted infection)  Vaginal lesion     Discharge Instructions      You have been re-screened for sexually transmitted infections and results will be available over the next few days. Staff will contact you if anything results as abnormal. A PCP may be able to screen for HSV through blood if indicated.   Return to clinic for new or urgent symptoms.  ED Prescriptions   None    PDMP not reviewed this encounter.   Harlow Lighter, Dangelo Guzzetta  N, FNP 02/05/24 [phone]

## 2024-02-06 ENCOUNTER — Telehealth (INDEPENDENT_AMBULATORY_CARE_PROVIDER_SITE_OTHER): Payer: Self-pay | Admitting: Primary Care

## 2024-02-06 LAB — CERVICOVAGINAL ANCILLARY ONLY
Chlamydia: NEGATIVE
Comment: NEGATIVE
Comment: NEGATIVE
Comment: NORMAL
Neisseria Gonorrhea: NEGATIVE
Trichomonas: NEGATIVE

## 2024-02-06 LAB — RPR: RPR Ser Ql: NONREACTIVE

## 2024-02-06 NOTE — Telephone Encounter (Signed)
 Called pt to confirm appt. Pt did not answer and LVM

## 2024-02-07 ENCOUNTER — Ambulatory Visit (INDEPENDENT_AMBULATORY_CARE_PROVIDER_SITE_OTHER): Admitting: Primary Care

## 2024-02-07 ENCOUNTER — Ambulatory Visit: Payer: Self-pay | Admitting: Emergency Medicine

## 2024-02-07 VITALS — BP 117/78 | HR 65 | Resp 16 | Ht 63.0 in | Wt 147.8 lb

## 2024-02-07 DIAGNOSIS — Z20828 Contact with and (suspected) exposure to other viral communicable diseases: Secondary | ICD-10-CM | POA: Diagnosis not present

## 2024-02-07 NOTE — Progress Notes (Signed)
 Just wait for your results to come back [facility]  Paige [name], is a 33 y.o. female  [id number]  [id number]  DOB - 1990/12/14  Chief Complaint  Patient presents with   Hospitalization Follow-up    Urgent Care-02/05/24       Subjective:   Paige [name] is a 33 y.o. female here today for an acute visit.  Patient was initially seen at urgent care on 02/05/2024 and ancillary cervical swab done and showed all negative results including being HIV.  Patient specifically asked for a HSV 1 and 2 to be done they advised her to go to her PCP for blood work because on examination no bumps were present.  She recently found out that her partner was positive for HSV 1 and 2 and had lesions.  Partner asked her to get tested.  She has not seen any herpetic lesions but ingrown hair bumps.  Today we will test blood for labs requested.  Explained to her if she actually has an outbreak that is when it needs to be cultured to confirm the diagnosis of HSV 2.  Patient verbalized understanding  No problems updated.  Comprehensive ROS Pertinent positive and negative noted in HPI   Allergies  Allergen Reactions   Cherry Hives    Hives and swelling   Latex     Past Medical History:  Diagnosis Date   Miscarriage    PCOS (polycystic ovarian syndrome)     Current Outpatient Medications on File Prior to Visit  Medication Sig Dispense Refill   cyclobenzaprine  (FLEXERIL ) 5 MG tablet Take 1 tablet (5 mg total) by mouth 2 (two) times daily as needed for muscle spasms. 20 tablet 0   fluticasone  (FLONASE ) 50 MCG/ACT nasal spray Place 1 spray into both nostrils daily. Begin by using 2 sprays in each nare daily for 3 to 5 days, then decrease to 1 spray in each nare daily. 15.8 mL 2   levocetirizine (XYZAL ) 5 MG tablet Take 1 tablet (5 mg total) by mouth every evening. 30 tablet 2   No current facility-administered medications on file prior to visit.   Health Maintenance  Topic Date  Due   Pneumococcal Vaccination (1 of 2 - PCV) Never done   COVID-19 Vaccine (1 - 2024-25 season) 07/08/2024*   DTaP/Tdap/Td vaccine (2 - Td or Tdap) 07/24/2024*   HPV Vaccine (2 - 3-dose series) 07/24/2024*   Flu Shot  03/29/2024   Pap with HPV screening  09/17/2026   Hepatitis C Screening  Completed   HIV Screening  Completed   Meningitis B Vaccine  Aged Out  *Topic was postponed. The date shown is not the original due date.    Objective:   Vitals:   02/07/24 0956  BP: 117/78  Pulse: 65  Resp: 16  SpO2: 99%  Weight: 147 lb 12.8 oz (67 kg)  Height: 5' 3 (1.6 m)    Physical Exam Vitals reviewed.  Constitutional:      Appearance: Normal appearance.  HENT:     Head: Normocephalic.     Right Ear: Tympanic membrane, ear canal and external ear normal.     Left Ear: Tympanic membrane, ear canal and external ear normal.     Mouth/Throat:     Mouth: Mucous membranes are moist.  Eyes:     Extraocular Movements: Extraocular movements intact.     Pupils: Pupils are equal, round, and reactive to light.  Cardiovascular:     Rate and Rhythm: Normal rate.  Pulmonary:  Effort: Pulmonary effort is normal.     Breath sounds: Normal breath sounds.  Abdominal:     General: Bowel sounds are normal.     Palpations: Abdomen is soft.  Musculoskeletal:        General: Normal range of motion.     Cervical back: Normal range of motion.  Skin:    General: Skin is warm and dry.  Neurological:     Mental Status: She is alert and oriented to person, place, and time.  Psychiatric:        Mood and Affect: Mood normal.        Behavior: Behavior normal.        Thought Content: Thought content normal.    Assessment & Plan  Paige [name] was seen today for hospitalization follow-up.  Diagnoses and all orders for this visit:  Exposure to herpes simplex virus (HSV) -     HSV 1 and 2 Ab, IgG  Patient have been counseled extensively about nutrition and exercise. Other issues discussed during this  visit include: low cholesterol diet, weight control and daily exercise, foot care, annual eye examinations at Ophthalmology, importance of adherence with medications and regular follow-up. We also discussed long term complications of uncontrolled diabetes and hypertension.   Return for PCP .  The patient was given clear instructions to go to ER or return to medical center if symptoms don't improve, worsen or new problems develop. The patient verbalized understanding. The patient was told to call to get lab results if they haven't heard anything in the next week.   This note has been created with Education officer, environmental. Any transcriptional errors are unintentional.   Marius Siemens, NP 02/07/2024, 10:38 AM

## 2024-02-08 LAB — HSV 1 AND 2 AB, IGG
HSV 1 Glycoprotein G Ab, IgG: REACTIVE — AB
HSV 2 IgG, Type Spec: NONREACTIVE

## 2024-02-09 ENCOUNTER — Ambulatory Visit (INDEPENDENT_AMBULATORY_CARE_PROVIDER_SITE_OTHER): Payer: Self-pay | Admitting: Primary Care

## 2024-04-09 DIAGNOSIS — Z419 Encounter for procedure for purposes other than remedying health state, unspecified: Secondary | ICD-10-CM | POA: Diagnosis not present

## 2024-06-20 ENCOUNTER — Ambulatory Visit (HOSPITAL_COMMUNITY): Payer: Self-pay

## 2024-07-13 ENCOUNTER — Ambulatory Visit (HOSPITAL_COMMUNITY): Payer: Self-pay

## 2024-07-21 ENCOUNTER — Encounter (HOSPITAL_COMMUNITY): Payer: Self-pay

## 2024-07-21 ENCOUNTER — Ambulatory Visit (HOSPITAL_COMMUNITY)
Admission: RE | Admit: 2024-07-21 | Discharge: 2024-07-21 | Disposition: A | Discharge status: Home / Self Care | Payer: Self-pay | Source: Ambulatory Visit

## 2024-07-21 VITALS — BP 120/80 | HR 57 | Temp 98.2°F | Resp 16

## 2024-07-21 DIAGNOSIS — N3001 Acute cystitis with hematuria: Secondary | ICD-10-CM | POA: Insufficient documentation

## 2024-07-21 DIAGNOSIS — K649 Unspecified hemorrhoids: Secondary | ICD-10-CM | POA: Insufficient documentation

## 2024-07-21 DIAGNOSIS — Z113 Encounter for screening for infections with a predominantly sexual mode of transmission: Secondary | ICD-10-CM | POA: Diagnosis present

## 2024-07-21 LAB — POCT URINE DIPSTICK
Bilirubin, UA: NEGATIVE
Glucose, UA: NEGATIVE mg/dL
Ketones, POC UA: NEGATIVE mg/dL
Nitrite, UA: POSITIVE — AB
Spec Grav, UA: 1.015 (ref 1.010–1.025)
Urobilinogen, UA: 0.2 U/dL
pH, UA: 8.5 — AB (ref 5.0–8.0)

## 2024-07-21 LAB — HIV ANTIBODY (ROUTINE TESTING W REFLEX): HIV Screen 4th Generation wRfx: NONREACTIVE

## 2024-07-21 LAB — POCT URINE PREGNANCY: Preg Test, Ur: NEGATIVE

## 2024-07-21 MED ORDER — PHENAZOPYRIDINE HCL 200 MG PO TABS: 200.0000 mg | ORAL_TABLET | Freq: Three times a day (TID) | ORAL | 0 refills | Status: AC

## 2024-07-21 MED ORDER — HYDROCORT-PRAMOXINE (PERIANAL) 1-1 % EX FOAM: 1.0000 | Freq: Two times a day (BID) | CUTANEOUS | 2 refills | Status: AC

## 2024-07-21 MED ORDER — CEFUROXIME AXETIL 250 MG PO TABS: 250.0000 mg | ORAL_TABLET | Freq: Two times a day (BID) | ORAL | 0 refills | Status: AC

## 2024-07-21 NOTE — ED Provider Notes (Signed)
 [facility] [place]  Note:  This document was prepared using Conservation officer, historic buildings and may include unintentional dictation errors.  MRN: [id number] DOB: August 05, 1991  Subjective:   Paige [name] is a 33 y.o. female presenting for STD testing due to lower abdominal suprapubic pain that was first experienced following intercourse.  Patient denies any vaginal discharge, vaginal lesions, dysuria, increased urinary frequency, flank pain, fever.  Patient states that she has no past history of STDs or known exposure but wants to be safe.  Patient reports last menstrual period was 05/23/2024.  Patient also reports chronic history of hemorrhoids with worsening pain and slight rectal bleeding.  Patient denies current medication therapy for symptoms.  Patient would like evaluation and medication to help with symptoms if possible.  No current facility-administered medications for this encounter.  Current Outpatient Medications:    cefUROXime  (CEFTIN ) 250 MG tablet, Take 1 tablet (250 mg total) by mouth 2 (two) times daily with a meal for 7 days., Disp: 14 tablet, Rfl: 0   hydrocortisone -pramoxine (PROCTOFOAM-HC) rectal foam, Place 1 applicator rectally 2 (two) times daily., Disp: 10 g, Rfl: 2   phenazopyridine  (PYRIDIUM ) 200 MG tablet, Take 1 tablet (200 mg total) by mouth 3 (three) times daily., Disp: 6 tablet, Rfl: 0   Allergies  Allergen Reactions   Cherry Hives    Hives and swelling   Latex     Past Medical History:  Diagnosis Date   Miscarriage    PCOS (polycystic ovarian syndrome)      Past Surgical History:  Procedure Laterality Date   DILATION AND CURETTAGE OF UTERUS      Family History  Problem Relation Age of Onset   Colon cancer Maternal Grandmother        not sure of age    Social History   Tobacco Use   Smoking status: Never   Smokeless tobacco: Never  Vaping Use   Vaping status: Never Used  Substance Use Topics   Alcohol use: Not Currently     Comment: occasional   Drug use: Not Currently    Types: Marijuana    Comment: occasional    ROS Refer to HPI for ROS details.  Objective:    Vitals: BP 120/80 (BP Location: Left Arm)   Pulse (!) 57   Temp 98.2 F (36.8 C) (Oral)   Resp 16   LMP 05/23/2024 (Exact Date)   SpO2 98%   Physical Exam Vitals and nursing note reviewed.  Constitutional:      General: She is not in acute distress.    Appearance: Normal appearance. She is well-developed. She is not ill-appearing or toxic-appearing.  HENT:     Head: Normocephalic and atraumatic.  Cardiovascular:     Rate and Rhythm: Normal rate.  Pulmonary:     Effort: Pulmonary effort is normal. No respiratory distress.  Abdominal:     General: There is no distension.     Tenderness: There is abdominal tenderness (Mild suprapubic abdominal tenderness with palpation.). There is no right CVA tenderness or left CVA tenderness.  Skin:    General: Skin is warm and dry.  Neurological:     General: No focal deficit present.     Mental Status: She is alert and oriented to person, place, and time.  Psychiatric:        Mood and Affect: Mood normal.        Behavior: Behavior normal.     Procedures  Results for orders placed or performed during the hospital  encounter of 07/21/24 (from the past 24 hours)  POC Urinalysis Dipstick     Status: Abnormal   Collection Time: 07/21/24  9:39 AM  Result Value Ref Range   Color, UA yellow yellow   Clarity, UA turbid (A) clear   Glucose, UA negative negative mg/dL   Bilirubin, UA negative negative   Ketones, POC UA negative negative mg/dL   Spec Grav, UA 8.984 8.989 - 1.025   Blood, UA trace-intact (A) negative   pH, UA 8.5 (A) 5.0 - 8.0   POC PROTEIN,UA trace negative, trace   Urobilinogen, UA 0.2 0.2 or 1.0 E.U./dL   Nitrite, UA Positive (A) Negative   Leukocytes, UA Small (1+) (A) Negative  POCT urine pregnancy     Status: None   Collection Time: 07/21/24  9:39 AM  Result Value Ref  Range   Preg Test, Ur Negative Negative    Assessment and Plan :     Discharge Instructions       1. Screening for STD (sexually transmitted disease) (Primary) - POCT urine pregnancy completed in UC is negative - Cervicovaginal swab collected in UC and sent to lab for further testing results should be available in 2 to 3 days. - RPR collected in UC and sent to lab - HIV Antibody (routine testing w rflx) collected in UC and sent to lab -Blood testing results will be available in 1 to 2 days.  2. Hemorrhoids, unspecified hemorrhoid type - hydrocortisone -pramoxine (PROCTOFOAM-HC) rectal foam; Place 1 applicator rectally 2 (two) times daily.  Dispense: 10 g; Refill: 2  3. Acute cystitis with hematuria - POC Urinalysis Dipstick completed in UC shows positive nitrite, small leukocytes, trace blood, these findings are possibly indicative of urinary tract infection - Urine Culture collected in UC and sent to lab for further testing results should be available in 2 to 3 days. - cefUROXime  (CEFTIN ) 250 MG tablet; Take 1 tablet (250 mg total) by mouth 2 (two) times daily with a meal for 7 days.  Dispense: 14 tablet; Refill: 0 - phenazopyridine  (PYRIDIUM ) 200 MG tablet; Take 1 tablet (200 mg total) by mouth 3 (three) times daily.  Dispense: 6 tablet; Refill: 0  -Continue to monitor symptoms for any change in severity if there is any escalation of current symptoms or development of new symptoms, such as increased abdominal pain, vaginal bleeding, fever, intractable nausea and vomiting, follow-up in ER for further evaluation and management.      Leotha Westermeyer B Quindarius Cabello   Brynlynn Walko, [facility] B, NP 07/21/24 1001

## 2024-07-21 NOTE — Discharge Instructions (Addendum)
  1. Screening for STD (sexually transmitted disease) (Primary) - POCT urine pregnancy completed in UC is negative - Cervicovaginal swab collected in UC and sent to lab for further testing results should be available in 2 to 3 days. - RPR collected in UC and sent to lab - HIV Antibody (routine testing w rflx) collected in UC and sent to lab -Blood testing results will be available in 1 to 2 days.  2. Hemorrhoids, unspecified hemorrhoid type - hydrocortisone -pramoxine (PROCTOFOAM-HC) rectal foam; Place 1 applicator rectally 2 (two) times daily.  Dispense: 10 g; Refill: 2  3. Acute cystitis with hematuria - POC Urinalysis Dipstick completed in UC shows positive nitrite, small leukocytes, trace blood, these findings are possibly indicative of urinary tract infection - Urine Culture collected in UC and sent to lab for further testing results should be available in 2 to 3 days. - cefUROXime  (CEFTIN ) 250 MG tablet; Take 1 tablet (250 mg total) by mouth 2 (two) times daily with a meal for 7 days.  Dispense: 14 tablet; Refill: 0 - phenazopyridine  (PYRIDIUM ) 200 MG tablet; Take 1 tablet (200 mg total) by mouth 3 (three) times daily.  Dispense: 6 tablet; Refill: 0  -Continue to monitor symptoms for any change in severity if there is any escalation of current symptoms or development of new symptoms, such as increased abdominal pain, vaginal bleeding, fever, intractable nausea and vomiting, follow-up in ER for further evaluation and management.

## 2024-07-21 NOTE — ED Triage Notes (Signed)
 Patient here today to be tested for Stds. She has been having some lower abd pain that worsens with intercourse. LMP 05/23/2024.   Patient also c/o a hemorrhoid for years that has been worsening.

## 2024-07-22 ENCOUNTER — Ambulatory Visit (HOSPITAL_COMMUNITY): Payer: Self-pay

## 2024-07-22 LAB — CERVICOVAGINAL ANCILLARY ONLY
Bacterial Vaginitis (gardnerella): POSITIVE — AB
Candida Glabrata: NEGATIVE
Candida Vaginitis: POSITIVE — AB
Chlamydia: NEGATIVE
Comment: NEGATIVE
Comment: NEGATIVE
Comment: NEGATIVE
Comment: NEGATIVE
Comment: NEGATIVE
Comment: NORMAL
Neisseria Gonorrhea: NEGATIVE
Trichomonas: NEGATIVE

## 2024-07-22 LAB — SYPHILIS: RPR W/REFLEX TO RPR TITER AND TREPONEMAL ANTIBODIES, TRADITIONAL SCREENING AND DIAGNOSIS ALGORITHM: RPR Ser Ql: NONREACTIVE

## 2024-07-22 MED ORDER — FLUCONAZOLE 150 MG PO TABS: 150.0000 mg | ORAL_TABLET | Freq: Once | ORAL | 0 refills | Status: AC

## 2024-07-22 MED ORDER — METRONIDAZOLE 500 MG PO TABS: 500.0000 mg | ORAL_TABLET | Freq: Two times a day (BID) | ORAL | 0 refills | Status: DC

## 2024-07-23 LAB — URINE CULTURE
Culture: >=100000 — AB
Special Requests: NORMAL

## 2024-07-23 MED ORDER — METRONIDAZOLE 0.75 % VA GEL: 1.0000 | Freq: Every day | VAGINAL | 0 refills | Status: AC

## 2024-07-31 ENCOUNTER — Ambulatory Visit: Admitting: Family Medicine

## 2024-08-25 ENCOUNTER — Ambulatory Visit (HOSPITAL_COMMUNITY): Payer: Self-pay

## 2024-09-02 ENCOUNTER — Ambulatory Visit (HOSPITAL_COMMUNITY): Payer: Self-pay | Attending: Family Medicine

## 2024-09-03 ENCOUNTER — Ambulatory Visit (HOSPITAL_COMMUNITY)
Admission: RE | Admit: 2024-09-03 | Discharge: 2024-09-03 | Disposition: A | Discharge status: Home / Self Care | Source: Ambulatory Visit | Attending: Emergency Medicine | Admitting: Emergency Medicine

## 2024-09-03 ENCOUNTER — Encounter (HOSPITAL_COMMUNITY): Payer: Self-pay

## 2024-09-03 VITALS — BP 105/69 | HR 69 | Temp 98.1°F | Resp 14

## 2024-09-03 DIAGNOSIS — R319 Hematuria, unspecified: Secondary | ICD-10-CM | POA: Diagnosis not present

## 2024-09-03 DIAGNOSIS — Z113 Encounter for screening for infections with a predominantly sexual mode of transmission: Secondary | ICD-10-CM | POA: Diagnosis not present

## 2024-09-03 DIAGNOSIS — Z87440 Personal history of urinary (tract) infections: Secondary | ICD-10-CM | POA: Diagnosis not present

## 2024-09-03 LAB — POCT URINALYSIS DIP (MANUAL ENTRY)
Bilirubin, UA: NEGATIVE
Glucose, UA: NEGATIVE mg/dL
Ketones, POC UA: NEGATIVE mg/dL
Leukocytes, UA: NEGATIVE
Nitrite, UA: NEGATIVE
Protein Ur, POC: NEGATIVE mg/dL
Spec Grav, UA: 1.02
Urobilinogen, UA: 0.2 U/dL
pH, UA: 6.5

## 2024-09-03 LAB — POCT URINE PREGNANCY: Preg Test, Ur: NEGATIVE

## 2024-09-03 NOTE — ED Triage Notes (Signed)
 Patient is requesting STD testing. Patient denies any symptoms. Patient is also requesting a urinalysis and states she had a UTI and wants the test repeated.

## 2024-09-03 NOTE — Discharge Instructions (Signed)
 You had blood in your urine today, please re-check this with your primary care provider  We are sending your urine sample for culture and will contact if antibiotics are needed based on your culture  The vaginal swab will screen for gonorrhea, chlamydia and trichomonas  Return to clinic for new or urgent symptoms

## 2024-09-03 NOTE — ED Provider Notes (Signed)
 " [facility]    CSN: [id number] Arrival date & time: 09/03/24  1152      History   Chief Complaint Chief Complaint  Patient presents with   Exposure to STD    Entered by patient    HPI Ailany [name] is a 34 y.o. female.   Patient presents to clinic requesting screening for sexually transmitted infections.  She was tested in November and was positive for BV and yeast.  Received treatment for these and completed this as prescribed.  She has not had any vaginal discharge, abdominal pain, malodorous vaginal discharge or vaginal itching.  Was also found to have a urinary tract infection at this time.  She has not had any dysuria or hematuria.  Reports she is still bleeding and is coming off of her menstrual cycle.  The history is provided by the patient and medical records.  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 Authorizing Provider  hydrocortisone -pramoxine (PROCTOFOAM-HC) rectal foam Place 1 applicator rectally 2 (two) times daily. 07/21/24   Reddick, Johnathan B, NP  phenazopyridine  (PYRIDIUM ) 200 MG tablet Take 1 tablet (200 mg total) by mouth 3 (three) times daily. Patient not taking: Reported on 09/03/2024 07/21/24   [name], NP    Family History Family History  Problem Relation Age of Onset   Colon  cancer Maternal Grandmother        not sure of age    Social History Social History[1]   Allergies   Cherry and Latex   Review of Systems Review of Systems  Per HPI  Physical Exam Triage Vital Signs ED Triage Vitals [09/03/24 1222]  Encounter Vitals Group     BP 105/69     Girls Systolic BP Percentile      Girls Diastolic BP Percentile      Boys Systolic BP Percentile      Boys Diastolic BP Percentile      Pulse Rate 69     Resp 14     Temp 98.1 F (36.7 C)     Temp Source Oral     SpO2 96 %     Weight      Height      Head Circumference      Peak Flow      Pain Score 0     Pain Loc      Pain Education      Exclude from Growth Chart    No data found.  Updated Vital Signs BP 105/69 (BP Location: Left Arm)  Pulse 69   Temp 98.1 F (36.7 C) (Oral)   Resp 14   LMP 08/20/2024 (Exact Date)   SpO2 96%   Visual Acuity Right Eye Distance:   Left Eye Distance:   Bilateral Distance:    Right Eye Near:   Left Eye Near:    Bilateral Near:     Physical Exam Vitals and nursing note reviewed.  Constitutional:      Appearance: Normal appearance.  HENT:     Head: Normocephalic and atraumatic.     Right Ear: External ear normal.     Left Ear: External ear normal.     Nose: Nose normal.     Mouth/Throat:     Mouth: Mucous membranes are moist.  Eyes:     Conjunctiva/sclera: Conjunctivae normal.  Cardiovascular:     Rate and Rhythm: Normal rate.  Pulmonary:     Effort: Pulmonary effort is normal. No respiratory distress.  Neurological:     General: No focal deficit present.     Mental Status: She is alert.  Psychiatric:        Mood and Affect: Mood normal.      UC Treatments / Results  Labs (all labs ordered are listed, but only abnormal results are displayed) Labs Reviewed  POCT URINALYSIS DIP (MANUAL ENTRY) - Abnormal; Notable for the following components:      Result Value   Blood, UA trace-intact (*)    All other components within normal  limits  POCT URINE PREGNANCY - Normal  URINE CULTURE  CERVICOVAGINAL ANCILLARY ONLY    EKG   Radiology No results found.  Procedures Procedures (including critical care time)  Medications Ordered in UC Medications - No data to display  Initial Impression / Assessment and Plan / UC Course  I have reviewed the triage vital signs and the nursing notes.  Pertinent labs & imaging results that were available during my care of the patient were reviewed by me and considered in my medical decision making (see chart for details).  Vitals and triage reviewed, patient is hemodynamically stable.  Asymptomatic hematuria, may be from diluted sample as patient continues to be on menstrual cycle.  Will send for culture to ensure previous UTI has been eradicated.  Recheck urine at PCP appointment on the 20th.  Discussed we do not check for yeast vaginitis or bacterial vaginosis if patients are asymptomatic.  Will check for gonorrhea, chlamydia and trichomonas via vaginal swab.  Plan of care, follow-up care and return precautions given, no questions at this time.    Final Clinical Impressions(s) / UC Diagnoses   Final diagnoses:  Encounter for screening examination for sexually transmitted infection  History of UTI  Hematuria, unspecified type     Discharge Instructions      You had blood in your urine today, please re-check this with your primary care provider  We are sending your urine sample for culture and will contact if antibiotics are needed based on your culture  The vaginal swab will screen for gonorrhea, chlamydia and trichomonas  Return to clinic for new or urgent symptoms      ED Prescriptions   None    PDMP not reviewed this encounter.    [1]  Social History Tobacco Use   Smoking status: Never   Smokeless tobacco: Never  Vaping Use   Vaping status: Never Used  Substance Use Topics   Alcohol use: Not Currently    Comment: occasional   Drug use: Not  Currently  Types: Marijuana    Comment: occasional     Paige Mingo SAILOR, FNP 09/03/24 1311  "

## 2024-09-04 ENCOUNTER — Ambulatory Visit: Payer: Self-pay

## 2024-09-04 LAB — URINE CULTURE: Culture: NO GROWTH

## 2024-09-04 LAB — CERVICOVAGINAL ANCILLARY ONLY
Chlamydia: NEGATIVE
Comment: NEGATIVE
Comment: NEGATIVE
Comment: NORMAL
Neisseria Gonorrhea: NEGATIVE
Trichomonas: NEGATIVE

## 2024-09-12 ENCOUNTER — Ambulatory Visit: Admitting: Family Medicine

## 2024-09-24 ENCOUNTER — Ambulatory Visit: Admitting: Family Medicine

## 2024-11-11 ENCOUNTER — Encounter: Payer: Self-pay | Admitting: Family Medicine
